# Patient Record
Sex: Female | Born: 1970 | Race: Black or African American | Hispanic: No | Smoking: Never smoker
Health system: Southern US, Community
[De-identification: ages and names within clinical notes are randomized; demographics above are authoritative.]

## PROBLEM LIST (undated history)

## (undated) DIAGNOSIS — E785 Hyperlipidemia, unspecified: Secondary | ICD-10-CM

## (undated) DIAGNOSIS — E119 Type 2 diabetes mellitus without complications: Secondary | ICD-10-CM

## (undated) DIAGNOSIS — I10 Essential (primary) hypertension: Secondary | ICD-10-CM

## (undated) DIAGNOSIS — T7840XA Allergy, unspecified, initial encounter: Secondary | ICD-10-CM

## (undated) HISTORY — DX: Hyperlipidemia, unspecified: E78.5

## (undated) HISTORY — DX: Allergy, unspecified, initial encounter: T78.40XA

## (undated) HISTORY — DX: Essential (primary) hypertension: I10

## (undated) HISTORY — PX: MYOMECTOMY ABDOMINAL APPROACH: SUR870

## (undated) HISTORY — DX: Type 2 diabetes mellitus without complications: E11.9

## (undated) HISTORY — PX: ABDOMINAL HYSTERECTOMY: SHX81

---

## 2014-11-06 ENCOUNTER — Ambulatory Visit: Payer: Self-pay | Admitting: Medical

## 2014-11-06 ENCOUNTER — Encounter: Payer: Self-pay | Admitting: Medical

## 2014-11-06 ENCOUNTER — Ambulatory Visit (INDEPENDENT_AMBULATORY_CARE_PROVIDER_SITE_OTHER): Payer: BC Managed Care – PPO | Admitting: Medical

## 2014-11-06 VITALS — BP 134/88 | HR 97 | Temp 98.2°F | Ht 66.5 in | Wt 219.4 lb

## 2014-11-06 DIAGNOSIS — J3089 Other allergic rhinitis: Secondary | ICD-10-CM | POA: Diagnosis not present

## 2014-11-06 DIAGNOSIS — J309 Allergic rhinitis, unspecified: Secondary | ICD-10-CM | POA: Insufficient documentation

## 2014-11-06 DIAGNOSIS — J0101 Acute recurrent maxillary sinusitis: Secondary | ICD-10-CM | POA: Diagnosis not present

## 2014-11-06 DIAGNOSIS — E785 Hyperlipidemia, unspecified: Secondary | ICD-10-CM

## 2014-11-06 DIAGNOSIS — I10 Essential (primary) hypertension: Secondary | ICD-10-CM | POA: Diagnosis not present

## 2014-11-06 MED ORDER — LORATADINE 10 MG PO TABS: 10.0000 mg | ORAL_TABLET | Freq: Every day | ORAL | Status: DC

## 2014-11-06 MED ORDER — CEFDINIR 300 MG PO CAPS: 300.0000 mg | ORAL_CAPSULE | Freq: Two times a day (BID) | ORAL | Status: DC

## 2014-11-06 MED ORDER — FLUTICASONE PROPIONATE 50 MCG/ACT NA SUSP: 2.0000 | Freq: Every day | NASAL | Status: DC

## 2014-11-06 NOTE — Progress Notes (Signed)
Pre visit review using our clinic review tool, if applicable. No additional management support is needed unless otherwise documented below in the visit note. 

## 2014-11-06 NOTE — Assessment & Plan Note (Addendum)
borderline lipids in past. Will recheck in future.

## 2014-11-06 NOTE — Patient Instructions (Addendum)
HTN (hypertension) pt has been on bp medication for 5 years.   Hyperlipidemia borderline lipids in past. Will recheck in future.  Allergic rhinitis You seem to have some allergy symptoms recently. Will rx claritin and flonase. If signs and symptoms worsen or change let us know.   You have some sinus pressure now. If you use the flonase and claritin.  but sinus pressure persists then start cefdnir antibiotic. I don't think antibiotic indicated just yet. But making available as we approach weekend.  Make appointment for physical in 1-2 months.

## 2014-11-06 NOTE — Progress Notes (Signed)
Subjective:    Patient ID: Christina Leblanc, female    DOB: 07-13-1970, 44 y.o.   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 Walks twice a week, 1 cup of coffee a day, Admits no healthy diet, divorced- no children.  Allergic rhinitis- worse season is fall and spring.   Htn- pt has been on bp medication for 5 years.   hyperlipidemia- borderline lipids in past  LMP- 2003 partial hysterectomy. Has one ovary. Pt told does not have to have pap smear per gynecologist who did procedure. Pt had 3-4 papsmears since the surgery.  Pt in with nasal congestion. This has been for 5 days. Pt states some stuffy sensation at night but very runny nose. Water eyes. No itching to eyes. No sneezing.   No fevers, no chills or sweats. Pt does have some sinus pressure.  Pt has tried nothing else other than garlic tablet.       Review of Systems  Constitutional: Negative for fever, chills, diaphoresis, activity change and fatigue.  HENT: Positive for congestion.   Respiratory: Negative for cough, chest tightness and shortness of breath.   Cardiovascular: Negative for chest pain, palpitations and leg swelling.  Gastrointestinal: Negative for nausea, vomiting and abdominal pain.  Musculoskeletal: Negative for neck pain and neck stiffness.  Neurological: Negative for dizziness and weakness.  Psychiatric/Behavioral: Negative for behavioral problems, confusion and agitation. The patient is not nervous/anxious.     Past Medical History  Diagnosis Date  . Hypertension   . Allergy   . Hyperlipidemia     Social History   Social History  . Marital Status: Divorced    Spouse Name: N/A  . Number of Children: N/A  . Years of Education: N/A   Occupational History  . Not on file.   Social History Main Topics  . Smoking status: Never Smoker   . Smokeless tobacco: Never Used  . Alcohol Use: 0.0 oz/week    0  Standard drinks or equivalent per week     Comment: wine occasionally .Twice.  . Drug Use: No  . Sexual Activity: Not on file   Other Topics Concern  . Not on file   Social History Narrative  . No narrative on file    Past Surgical History  Procedure Laterality Date  . Abdominal hysterectomy      2003    Family History  Problem Relation Age of Onset  . Diabetes Mother   . Diabetes Father   . Heart disease Father   . Cancer Brother   . Hypertension Brother     Allergies  Allergen Reactions  . Claritin [Loratadine]     No current outpatient prescriptions on file prior to visit.   No current facility-administered medications on file prior to visit.    BP 134/88 mmHg  Pulse 97  Temp(Src) 98.2 F (36.8 C) (Oral)  Ht 5' 6.5" (1.689 m)  Wt 219 lb 6.4 oz (99.519 kg)  BMI 34.89 kg/m2  SpO2 98%  LMP        Objective:   Physical Exam  General  Mental Status - Alert. General Appearance - Well groomed. Not in acute distress.  Skin Rashes- No Rashes.  HEENT Head- Normal. Ear Auditory Canal - Left- Normal. Right - Normal.Tympanic Membrane- Left- Normal. Right- Normal. Eye Sclera/Conjunctiva- Left- Normal. Right- Normal. Nose & Sinuses Nasal Mucosa- Left-  Boggy and Congested. Right-  Boggy and  Congested.Bilateral maxillary pressure but no  frontal sinus pressure. Mouth & Throat Lips: Upper Lip- Normal: no dryness, cracking, pallor, cyanosis, or vesicular eruption. Lower Lip-Normal: no dryness, cracking, pallor, cyanosis or vesicular eruption. Buccal Mucosa- Bilateral- No Aphthous ulcers. Oropharynx- No Discharge or Erythema. +pnd. Tonsils: Characteristics- Bilateral- No Erythema or Congestion. Size/Enlargement- Bilateral- No enlargement. Discharge- bilateral-None.  Neck Neck- Supple. No Masses.   Chest and Lung Exam Auscultation: Breath Sounds:-Clear even and unlabored.  Cardiovascular Auscultation:Rythm- Regular, rate and rhythm. Murmurs & Other  Heart Sounds:Ausculatation of the heart reveal- No Murmurs.  Lymphatic Head & Neck General Head & Neck Lymphatics: Bilateral: Description- No Localized lymphadenopathy.       Assessment & Plan:

## 2014-11-06 NOTE — Assessment & Plan Note (Signed)
pt has been on bp medication for 5 years.

## 2014-11-06 NOTE — Assessment & Plan Note (Signed)
You seem to have some allergy symptoms recently. Will rx claritin and flonase. If signs and symptoms worsen or change let us know.

## 2014-12-31 ENCOUNTER — Telehealth: Payer: Self-pay | Admitting: Medical

## 2014-12-31 MED ORDER — HYDROCHLOROTHIAZIDE 25 MG PO TABS: 25.0000 mg | ORAL_TABLET | Freq: Every day | ORAL | Status: DC

## 2014-12-31 NOTE — Telephone Encounter (Signed)
Pharmacy: [id number] - [place], [place]  Reason for call: Pt has been out of hydrochlorothiazide for 2 days. She said [facility] sent request 2 times. Please send today.

## 2014-12-31 NOTE — Telephone Encounter (Signed)
Spoke with pt and she voices understanding that she will make a follow up appointment.

## 2015-01-08 ENCOUNTER — Encounter: Payer: Self-pay | Admitting: Medical

## 2015-01-08 ENCOUNTER — Other Ambulatory Visit (HOSPITAL_COMMUNITY)
Admission: RE | Admit: 2015-01-08 | Discharge: 2015-01-08 | Disposition: A | Discharge status: Home / Self Care | Payer: BC Managed Care – PPO | Source: Ambulatory Visit | Attending: Medical | Admitting: Medical

## 2015-01-08 ENCOUNTER — Ambulatory Visit (INDEPENDENT_AMBULATORY_CARE_PROVIDER_SITE_OTHER): Payer: BC Managed Care – PPO | Admitting: Medical

## 2015-01-08 ENCOUNTER — Ambulatory Visit (HOSPITAL_BASED_OUTPATIENT_CLINIC_OR_DEPARTMENT_OTHER)
Admission: RE | Admit: 2015-01-08 | Discharge: 2015-01-08 | Disposition: A | Discharge status: Home / Self Care | Payer: BC Managed Care – PPO | Source: Ambulatory Visit | Attending: Medical | Admitting: Medical

## 2015-01-08 VITALS — BP 130/88 | HR 78 | Temp 98.3°F | Ht 66.5 in | Wt 223.4 lb

## 2015-01-08 DIAGNOSIS — Z0000 Encounter for general adult medical examination without abnormal findings: Secondary | ICD-10-CM | POA: Diagnosis not present

## 2015-01-08 DIAGNOSIS — Z1211 Encounter for screening for malignant neoplasm of colon: Secondary | ICD-10-CM | POA: Diagnosis not present

## 2015-01-08 DIAGNOSIS — Z0001 Encounter for general adult medical examination with abnormal findings: Secondary | ICD-10-CM | POA: Diagnosis not present

## 2015-01-08 DIAGNOSIS — Z113 Encounter for screening for infections with a predominantly sexual mode of transmission: Secondary | ICD-10-CM | POA: Diagnosis not present

## 2015-01-08 DIAGNOSIS — N390 Urinary tract infection, site not specified: Secondary | ICD-10-CM

## 2015-01-08 DIAGNOSIS — M25572 Pain in left ankle and joints of left foot: Secondary | ICD-10-CM

## 2015-01-08 DIAGNOSIS — M7989 Other specified soft tissue disorders: Secondary | ICD-10-CM | POA: Insufficient documentation

## 2015-01-08 DIAGNOSIS — R82998 Other abnormal findings in urine: Secondary | ICD-10-CM

## 2015-01-08 DIAGNOSIS — Z124 Encounter for screening for malignant neoplasm of cervix: Secondary | ICD-10-CM

## 2015-01-08 DIAGNOSIS — N760 Acute vaginitis: Secondary | ICD-10-CM | POA: Insufficient documentation

## 2015-01-08 DIAGNOSIS — Z01419 Encounter for gynecological examination (general) (routine) without abnormal findings: Secondary | ICD-10-CM | POA: Diagnosis not present

## 2015-01-08 LAB — CBC WITH DIFFERENTIAL/PLATELET
BASOS ABS: 0 10*3/uL (ref 0.0–0.1)
Basophils Relative: 0.7 % (ref 0.0–3.0)
EOS ABS: 0.1 10*3/uL (ref 0.0–0.7)
Eosinophils Relative: 0.8 % (ref 0.0–5.0)
HCT: 41.8 % (ref 36.0–46.0)
HEMOGLOBIN: 13.3 g/dL (ref 12.0–15.0)
Lymphocytes Relative: 40.9 % (ref 12.0–46.0)
Lymphs Abs: 2.8 10*3/uL (ref 0.7–4.0)
MCHC: 31.8 g/dL (ref 30.0–36.0)
MCV: 86.9 fl (ref 78.0–100.0)
MONO ABS: 0.6 10*3/uL (ref 0.1–1.0)
Monocytes Relative: 8.3 % (ref 3.0–12.0)
Neutro Abs: 3.3 10*3/uL (ref 1.4–7.7)
Neutrophils Relative %: 49.3 % (ref 43.0–77.0)
Platelets: 358 10*3/uL (ref 150.0–400.0)
RBC: 4.81 Mil/uL (ref 3.87–5.11)
RDW: 15.7 % — AB (ref 11.5–15.5)
WBC: 6.7 10*3/uL (ref 4.0–10.5)

## 2015-01-08 LAB — LIPID PANEL
CHOL/HDL RATIO: 4
CHOLESTEROL: 222 mg/dL — AB (ref 0–200)
HDL: 50.4 mg/dL (ref >39.00)
LDL CALC: 151 mg/dL — AB (ref 0–99)
NONHDL: 171.9
Triglycerides: 104 mg/dL (ref 0.0–149.0)
VLDL: 20.8 mg/dL (ref 0.0–40.0)

## 2015-01-08 LAB — POCT URINALYSIS DIPSTICK
Bilirubin, UA: NEGATIVE
Glucose, UA: NEGATIVE
KETONES UA: NEGATIVE
Nitrite, UA: NEGATIVE
PROTEIN UA: NEGATIVE
RBC UA: NEGATIVE
SPEC GRAV UA: 1.01
Urobilinogen, UA: 0.2
pH, UA: 6.5

## 2015-01-08 LAB — COMPREHENSIVE METABOLIC PANEL
ALBUMIN: 4.3 g/dL (ref 3.5–5.2)
ALT: 19 U/L (ref 0–35)
AST: 24 U/L (ref 0–37)
Alkaline Phosphatase: 49 U/L (ref 39–117)
BUN: 8 mg/dL (ref 6–23)
CHLORIDE: 97 meq/L (ref 96–112)
CO2: 34 mEq/L — ABNORMAL HIGH (ref 19–32)
CREATININE: 0.83 mg/dL (ref 0.40–1.20)
Calcium: 9.7 mg/dL (ref 8.4–10.5)
GFR: 95.32 mL/min (ref >60.00)
Glucose, Bld: 105 mg/dL — ABNORMAL HIGH (ref 70–99)
Potassium: 3.4 mEq/L — ABNORMAL LOW (ref 3.5–5.1)
SODIUM: 138 meq/L (ref 135–145)
TOTAL PROTEIN: 7.9 g/dL (ref 6.0–8.3)
Total Bilirubin: 0.5 mg/dL (ref 0.2–1.2)

## 2015-01-08 LAB — TSH: TSH: 1.58 u[IU]/mL (ref 0.35–4.50)

## 2015-01-08 MED ORDER — HYDROCHLOROTHIAZIDE 25 MG PO TABS: 25.0000 mg | ORAL_TABLET | Freq: Every day | ORAL | Status: DC

## 2015-01-08 NOTE — Assessment & Plan Note (Signed)
Will get cbc, cmp, tsh, lipid panel today. As well as ua.

## 2015-01-08 NOTE — Patient Instructions (Addendum)
Wellness examination Will get cbc, cmp, tsh, lipid panel today. As well as ua.  Also do std screening and papsmear.  For your ankle pain intermittent will get ankle xray today.  Follow up 1 year for physical or as needed.(3-4 weeks bp check if bp still elevated after exam)   Counseled with pt that I think was successful with pap but will wait on results if not then will need repeat. Would recommend with gynecologist. Also since I did not do the bimanual today would recommend that on wellness exam next year she would need that. We could have her do that with female provider.     Preventive Care for Adults, Female A healthy lifestyle and preventive care can promote health and wellness. Preventive health guidelines for women include the following key practices.  A routine yearly physical is a good way to check with your health care provider about your health and preventive screening. It is a chance to share any concerns and updates on your health and to receive a thorough exam.  Visit your dentist for a routine exam and preventive care every 6 months. Brush your teeth twice a day and floss once a day. Good oral hygiene prevents tooth decay and gum disease.  The frequency of eye exams is based on your age, health, family medical history, use of contact lenses, and other factors. Follow your health care provider's recommendations for frequency of eye exams.  Eat a healthy diet. Foods like vegetables, fruits, whole grains, low-fat dairy products, and lean protein foods contain the nutrients you need without too many calories. Decrease your intake of foods high in solid fats, added sugars, and salt. Eat the right amount of calories for you.Get information about a proper diet from your health care provider, if necessary.  Regular physical exercise is one of the most important things you can do for your health. Most adults should get at least 150 minutes of moderate-intensity exercise (any activity  that increases your heart rate and causes you to sweat) each week. In addition, most adults need muscle-strengthening exercises on 2 or more days a week.  Maintain a healthy weight. The body mass index (BMI) is a screening tool to identify possible weight problems. It provides an estimate of body fat based on height and weight. Your health care provider can find your BMI and can help you achieve or maintain a healthy weight.For adults 20 years and older:  A BMI below 18.5 is considered underweight.  A BMI of 18.5 to 24.9 is normal.  A BMI of 25 to 29.9 is considered overweight.  A BMI of 30 and above is considered obese.  Maintain normal blood lipids and cholesterol levels by exercising and minimizing your intake of saturated fat. Eat a balanced diet with plenty of fruit and vegetables. Blood tests for lipids and cholesterol should begin at age 6 and be repeated every 5 years. If your lipid or cholesterol levels are high, you are over 50, or you are at high risk for heart disease, you may need your cholesterol levels checked more frequently.Ongoing high lipid and cholesterol levels should be treated with medicines if diet and exercise are not working.  If you smoke, find out from your health care provider how to quit. If you do not use tobacco, do not start.  Lung cancer screening is recommended for adults aged 25-80 years who are at high risk for developing lung cancer because of a history of smoking. A yearly low-dose CT scan of  the lungs is recommended for people who have at least a 30-pack-year history of smoking and are a current smoker or have quit within the past 15 years. A pack year of smoking is smoking an average of 1 pack of cigarettes a day for 1 year (for example: 1 pack a day for 30 years or 2 packs a day for 15 years). Yearly screening should continue until the smoker has stopped smoking for at least 15 years. Yearly screening should be stopped for people who develop a health  problem that would prevent them from having lung cancer treatment.  If you are pregnant, do not drink alcohol. If you are breastfeeding, be very cautious about drinking alcohol. If you are not pregnant and choose to drink alcohol, do not have more than 1 drink per day. One drink is considered to be 12 ounces (355 mL) of beer, 5 ounces (148 mL) of wine, or 1.5 ounces (44 mL) of liquor.  Avoid use of street drugs. Do not share needles with anyone. Ask for help if you need support or instructions about stopping the use of drugs.  High blood pressure causes heart disease and increases the risk of stroke. Your blood pressure should be checked at least every 1 to 2 years. Ongoing high blood pressure should be treated with medicines if weight loss and exercise do not work.  If you are 48-95 years old, ask your health care provider if you should take aspirin to prevent strokes.  Diabetes screening is done by taking a blood sample to check your blood glucose level after you have not eaten for a certain period of time (fasting). If you are not overweight and you do not have risk factors for diabetes, you should be screened once every 3 years starting at age 86. If you are overweight or obese and you are 15-28 years of age, you should be screened for diabetes every year as part of your cardiovascular risk assessment.  Breast cancer screening is essential preventive care for women. You should practice "breast self-awareness." This means understanding the normal appearance and feel of your breasts and may include breast self-examination. Any changes detected, no matter how small, should be reported to a health care provider. Women in their 49s and 30s should have a clinical breast exam (CBE) by a health care provider as part of a regular health exam every 1 to 3 years. After age 48, women should have a CBE every year. Starting at age 30, women should consider having a mammogram (breast X-ray test) every year. Women  who have a family history of breast cancer should talk to their health care provider about genetic screening. Women at a high risk of breast cancer should talk to their health care providers about having an MRI and a mammogram every year.  Breast cancer gene (BRCA)-related cancer risk assessment is recommended for women who have family members with BRCA-related cancers. BRCA-related cancers include breast, ovarian, tubal, and peritoneal cancers. Having family members with these cancers may be associated with an increased risk for harmful changes (mutations) in the breast cancer genes BRCA1 and BRCA2. Results of the assessment will determine the need for genetic counseling and BRCA1 and BRCA2 testing.  Your health care provider may recommend that you be screened regularly for cancer of the pelvic organs (ovaries, uterus, and vagina). This screening involves a pelvic examination, including checking for microscopic changes to the surface of your cervix (Pap test). You may be encouraged to have this screening done  every 3 years, beginning at age 18.  For women ages 51-65, health care providers may recommend pelvic exams and Pap testing every 3 years, or they may recommend the Pap and pelvic exam, combined with testing for human papilloma virus (HPV), every 5 years. Some types of HPV increase your risk of cervical cancer. Testing for HPV may also be done on women of any age with unclear Pap test results.  Other health care providers may not recommend any screening for nonpregnant women who are considered low risk for pelvic cancer and who do not have symptoms. Ask your health care provider if a screening pelvic exam is right for you.  If you have had past treatment for cervical cancer or a condition that could lead to cancer, you need Pap tests and screening for cancer for at least 20 years after your treatment. If Pap tests have been discontinued, your risk factors (such as having a new sexual partner) need to  be reassessed to determine if screening should resume. Some women have medical problems that increase the chance of getting cervical cancer. In these cases, your health care provider may recommend more frequent screening and Pap tests.  Colorectal cancer can be detected and often prevented. Most routine colorectal cancer screening begins at the age of 53 years and continues through age 66 years. However, your health care provider may recommend screening at an earlier age if you have risk factors for colon cancer. On a yearly basis, your health care provider may provide home test kits to check for hidden blood in the stool. Use of a small camera at the end of a tube, to directly examine the colon (sigmoidoscopy or colonoscopy), can detect the earliest forms of colorectal cancer. Talk to your health care provider about this at age 32, when routine screening begins. Direct exam of the colon should be repeated every 5-10 years through age 80 years, unless early forms of precancerous polyps or small growths are found.  People who are at an increased risk for hepatitis B should be screened for this virus. You are considered at high risk for hepatitis B if:  You were born in a country where hepatitis B occurs often. Talk with your health care provider about which countries are considered high risk.  Your parents were born in a high-risk country and you have not received a shot to protect against hepatitis B (hepatitis B vaccine).  You have HIV or AIDS.  You use needles to inject street drugs.  You live with, or have sex with, someone who has hepatitis B.  You get hemodialysis treatment.  You take certain medicines for conditions like cancer, organ transplantation, and autoimmune conditions.  Hepatitis C blood testing is recommended for all people born from [birth year] through 1966 and any individual with known risks for hepatitis C.  Practice safe sex. Use condoms and avoid high-risk sexual practices to  reduce the spread of sexually transmitted infections (STIs). STIs include gonorrhea, chlamydia, syphilis, trichomonas, herpes, HPV, and human immunodeficiency virus (HIV). Herpes, HIV, and HPV are viral illnesses that have no cure. They can result in disability, cancer, and death.  You should be screened for sexually transmitted illnesses (STIs) including gonorrhea and chlamydia if:  You are sexually active and are younger than 24 years.  You are older than 24 years and your health care provider tells you that you are at risk for this type of infection.  Your sexual activity has changed since you were last screened and you  are at an increased risk for chlamydia or gonorrhea. Ask your health care provider if you are at risk.  If you are at risk of being infected with HIV, it is recommended that you take a prescription medicine daily to prevent HIV infection. This is called preexposure prophylaxis (PrEP). You are considered at risk if:  You are sexually active and do not regularly use condoms or know the HIV status of your partner(s).  You take drugs by injection.  You are sexually active with a partner who has HIV.  Talk with your health care provider about whether you are at high risk of being infected with HIV. If you choose to begin PrEP, you should first be tested for HIV. You should then be tested every 3 months for as long as you are taking PrEP.  Osteoporosis is a disease in which the bones lose minerals and strength with aging. This can result in serious bone fractures or breaks. The risk of osteoporosis can be identified using a bone density scan. Women ages 57 years and over and women at risk for fractures or osteoporosis should discuss screening with their health care providers. Ask your health care provider whether you should take a calcium supplement or vitamin D to reduce the rate of osteoporosis.  Menopause can be associated with physical symptoms and risks. Hormone replacement  therapy is available to decrease symptoms and risks. You should talk to your health care provider about whether hormone replacement therapy is right for you.  Use sunscreen. Apply sunscreen liberally and repeatedly throughout the day. You should seek shade when your shadow is shorter than you. Protect yourself by wearing long sleeves, pants, a wide-brimmed hat, and sunglasses year round, whenever you are outdoors.  Once a month, do a whole body skin exam, using a mirror to look at the skin on your back. Tell your health care provider of new moles, moles that have irregular borders, moles that are larger than a pencil eraser, or moles that have changed in shape or color.  Stay current with required vaccines (immunizations).  Influenza vaccine. All adults should be immunized every year.  Tetanus, diphtheria, and acellular pertussis (Td, Tdap) vaccine. Pregnant women should receive 1 dose of Tdap vaccine during each pregnancy. The dose should be obtained regardless of the length of time since the last dose. Immunization is preferred during the 27th-36th week of gestation. An adult who has not previously received Tdap or who does not know her vaccine status should receive 1 dose of Tdap. This initial dose should be followed by tetanus and diphtheria toxoids (Td) booster doses every 10 years. Adults with an unknown or incomplete history of completing a 3-dose immunization series with Td-containing vaccines should begin or complete a primary immunization series including a Tdap dose. Adults should receive a Td booster every 10 years.  Varicella vaccine. An adult without evidence of immunity to varicella should receive 2 doses or a second dose if she has previously received 1 dose. Pregnant females who do not have evidence of immunity should receive the first dose after pregnancy. This first dose should be obtained before leaving the health care facility. The second dose should be obtained 4-8 weeks after the  first dose.  Human papillomavirus (HPV) vaccine. Females aged 13-26 years who have not received the vaccine previously should obtain the 3-dose series. The vaccine is not recommended for use in pregnant females. However, pregnancy testing is not needed before receiving a dose. If a female is found to  be pregnant after receiving a dose, no treatment is needed. In that case, the remaining doses should be delayed until after the pregnancy. Immunization is recommended for any person with an immunocompromised condition through the age of 92 years if she did not get any or all doses earlier. During the 3-dose series, the second dose should be obtained 4-8 weeks after the first dose. The third dose should be obtained 24 weeks after the first dose and 16 weeks after the second dose.  Zoster vaccine. One dose is recommended for adults aged 64 years or older unless certain conditions are present.  Measles, mumps, and rubella (MMR) vaccine. Adults born before [birth year] generally are considered immune to measles and mumps. Adults born in [birth year] or later should have 1 or more doses of MMR vaccine unless there is a contraindication to the vaccine or there is laboratory evidence of immunity to each of the three diseases. A routine second dose of MMR vaccine should be obtained at least 28 days after the first dose for students attending postsecondary schools, health care workers, or international travelers. People who received inactivated measles vaccine or an unknown type of measles vaccine during 1963-1967 should receive 2 doses of MMR vaccine. People who received inactivated mumps vaccine or an unknown type of mumps vaccine before 1979 and are at high risk for mumps infection should consider immunization with 2 doses of MMR vaccine. For females of childbearing age, rubella immunity should be determined. If there is no evidence of immunity, females who are not pregnant should be vaccinated. If there is no evidence of immunity,  females who are pregnant should delay immunization until after pregnancy. Unvaccinated health care workers born before [birth year] who lack laboratory evidence of measles, mumps, or rubella immunity or laboratory confirmation of disease should consider measles and mumps immunization with 2 doses of MMR vaccine or rubella immunization with 1 dose of MMR vaccine.  Pneumococcal 13-valent conjugate (PCV13) vaccine. When indicated, a person who is uncertain of his immunization history and has no record of immunization should receive the PCV13 vaccine. All adults 43 years of age and older should receive this vaccine. An adult aged 30 years or older who has certain medical conditions and has not been previously immunized should receive 1 dose of PCV13 vaccine. This PCV13 should be followed with a dose of pneumococcal polysaccharide (PPSV23) vaccine. Adults who are at high risk for pneumococcal disease should obtain the PPSV23 vaccine at least 8 weeks after the dose of PCV13 vaccine. Adults older than 45 years of age who have normal immune system function should obtain the PPSV23 vaccine dose at least 1 year after the dose of PCV13 vaccine.  Pneumococcal polysaccharide (PPSV23) vaccine. When PCV13 is also indicated, PCV13 should be obtained first. All adults aged 49 years and older should be immunized. An adult younger than age 17 years who has certain medical conditions should be immunized. Any person who resides in a nursing home or long-term care facility should be immunized. An adult smoker should be immunized. People with an immunocompromised condition and certain other conditions should receive both PCV13 and PPSV23 vaccines. People with human immunodeficiency virus (HIV) infection should be immunized as soon as possible after diagnosis. Immunization during chemotherapy or radiation therapy should be avoided. Routine use of PPSV23 vaccine is not recommended for American Indians, [place] Natives, or people younger than 65  years unless there are medical conditions that require PPSV23 vaccine. When indicated, people who have unknown immunization and have no record  of immunization should receive PPSV23 vaccine. One-time revaccination 5 years after the first dose of PPSV23 is recommended for people aged 19-64 years who have chronic kidney failure, nephrotic syndrome, asplenia, or immunocompromised conditions. People who received 1-2 doses of PPSV23 before age 84 years should receive another dose of PPSV23 vaccine at age 33 years or later if at least 5 years have passed since the previous dose. Doses of PPSV23 are not needed for people immunized with PPSV23 at or after age 62 years.  Meningococcal vaccine. Adults with asplenia or persistent complement component deficiencies should receive 2 doses of quadrivalent meningococcal conjugate (MenACWY-D) vaccine. The doses should be obtained at least 2 months apart. Microbiologists working with certain meningococcal bacteria, [employer] recruits, people at risk during an outbreak, and people who travel to or live in countries with a high rate of meningitis should be immunized. A first-year college student up through age 47 years who is living in a residence hall should receive a dose if she did not receive a dose on or after her 16th birthday. Adults who have certain high-risk conditions should receive one or more doses of vaccine.  Hepatitis A vaccine. Adults who wish to be protected from this disease, have certain high-risk conditions, work with hepatitis A-infected animals, work in hepatitis A research labs, or travel to or work in countries with a high rate of hepatitis A should be immunized. Adults who were previously unvaccinated and who anticipate close contact with an international adoptee during the first 60 days after arrival in the [place] from a country with a high rate of hepatitis A should be immunized.  Hepatitis B vaccine. Adults who wish to be protected from this  disease, have certain high-risk conditions, may be exposed to blood or other infectious body fluids, are household contacts or sex partners of hepatitis B positive people, are clients or workers in certain care facilities, or travel to or work in countries with a high rate of hepatitis B should be immunized.  Haemophilus influenzae type b (Hib) vaccine. A previously unvaccinated person with asplenia or sickle cell disease or having a scheduled splenectomy should receive 1 dose of Hib vaccine. Regardless of previous immunization, a recipient of a hematopoietic stem cell transplant should receive a 3-dose series 6-12 months after her successful transplant. Hib vaccine is not recommended for adults with HIV infection. Preventive Services / Frequency Ages 2 to 90 years  Blood pressure check.** / Every 3-5 years.  Lipid and cholesterol check.** / Every 5 years beginning at age 76.  Clinical breast exam.** / Every 3 years for women in their 2s and 27s.  BRCA-related cancer risk assessment.** / For women who have family members with a BRCA-related cancer (breast, ovarian, tubal, or peritoneal cancers).  Pap test.** / Every 2 years from ages 34 through 70. Every 3 years starting at age 76 through age 38 or 20 with a history of 3 consecutive normal Pap tests.  HPV screening.** / Every 3 years from ages 70 through ages 77 to 13 with a history of 3 consecutive normal Pap tests.  Hepatitis C blood test.** / For any individual with known risks for hepatitis C.  Skin self-exam. / Monthly.  Influenza vaccine. / Every year.  Tetanus, diphtheria, and acellular pertussis (Tdap, Td) vaccine.** / Consult your health care provider. Pregnant women should receive 1 dose of Tdap vaccine during each pregnancy. 1 dose of Td every 10 years.  Varicella vaccine.** / Consult your health care provider. Pregnant females  who do not have evidence of immunity should receive the first dose after pregnancy.  HPV vaccine. /  3 doses over 6 months, if 4 and younger. The vaccine is not recommended for use in pregnant females. However, pregnancy testing is not needed before receiving a dose.  Measles, mumps, rubella (MMR) vaccine.** / You need at least 1 dose of MMR if you were born in 1957 or later. You may also need a 2nd dose. For females of childbearing age, rubella immunity should be determined. If there is no evidence of immunity, females who are not pregnant should be vaccinated. If there is no evidence of immunity, females who are pregnant should delay immunization until after pregnancy.  Pneumococcal 13-valent conjugate (PCV13) vaccine.** / Consult your health care provider.  Pneumococcal polysaccharide (PPSV23) vaccine.** / 1 to 2 doses if you smoke cigarettes or if you have certain conditions.  Meningococcal vaccine.** / 1 dose if you are age 86 to 25 years and a Market researcher living in a residence hall, or have one of several medical conditions, you need to get vaccinated against meningococcal disease. You may also need additional booster doses.  Hepatitis A vaccine.** / Consult your health care provider.  Hepatitis B vaccine.** / Consult your health care provider.  Haemophilus influenzae type b (Hib) vaccine.** / Consult your health care provider. Ages 33 to 69 years  Blood pressure check.** / Every year.  Lipid and cholesterol check.** / Every 5 years beginning at age 99 years.  Lung cancer screening. / Every year if you are aged 5-80 years and have a 30-pack-year history of smoking and currently smoke or have quit within the past 15 years. Yearly screening is stopped once you have quit smoking for at least 15 years or develop a health problem that would prevent you from having lung cancer treatment.  Clinical breast exam.** / Every year after age 16 years.  BRCA-related cancer risk assessment.** / For women who have family members with a BRCA-related cancer (breast, ovarian, tubal, or  peritoneal cancers).  Mammogram.** / Every year beginning at age 55 years and continuing for as long as you are in good health. Consult with your health care provider.  Pap test.** / Every 3 years starting at age 85 years through age 75 or 55 years with a history of 3 consecutive normal Pap tests.  HPV screening.** / Every 3 years from ages 63 years through ages 63 to 25 years with a history of 3 consecutive normal Pap tests.  Fecal occult blood test (FOBT) of stool. / Every year beginning at age 16 years and continuing until age 70 years. You may not need to do this test if you get a colonoscopy every 10 years.  Flexible sigmoidoscopy or colonoscopy.** / Every 5 years for a flexible sigmoidoscopy or every 10 years for a colonoscopy beginning at age 29 years and continuing until age 72 years.  Hepatitis C blood test.** / For all people born from [birth year] through 1966 and any individual with known risks for hepatitis C.  Skin self-exam. / Monthly.  Influenza vaccine. / Every year.  Tetanus, diphtheria, and acellular pertussis (Tdap/Td) vaccine.** / Consult your health care provider. Pregnant women should receive 1 dose of Tdap vaccine during each pregnancy. 1 dose of Td every 10 years.  Varicella vaccine.** / Consult your health care provider. Pregnant females who do not have evidence of immunity should receive the first dose after pregnancy.  Zoster vaccine.** / 1 dose for adults aged  72 years or older.  Measles, mumps, rubella (MMR) vaccine.** / You need at least 1 dose of MMR if you were born in 1957 or later. You may also need a second dose. For females of childbearing age, rubella immunity should be determined. If there is no evidence of immunity, females who are not pregnant should be vaccinated. If there is no evidence of immunity, females who are pregnant should delay immunization until after pregnancy.  Pneumococcal 13-valent conjugate (PCV13) vaccine.** / Consult your health care  provider.  Pneumococcal polysaccharide (PPSV23) vaccine.** / 1 to 2 doses if you smoke cigarettes or if you have certain conditions.  Meningococcal vaccine.** / Consult your health care provider.  Hepatitis A vaccine.** / Consult your health care provider.  Hepatitis B vaccine.** / Consult your health care provider.  Haemophilus influenzae type b (Hib) vaccine.** / Consult your health care provider. Ages 65 years and over  Blood pressure check.** / Every year.  Lipid and cholesterol check.** / Every 5 years beginning at age 32 years.  Lung cancer screening. / Every year if you are aged 75-80 years and have a 30-pack-year history of smoking and currently smoke or have quit within the past 15 years. Yearly screening is stopped once you have quit smoking for at least 15 years or develop a health problem that would prevent you from having lung cancer treatment.  Clinical breast exam.** / Every year after age 28 years.  BRCA-related cancer risk assessment.** / For women who have family members with a BRCA-related cancer (breast, ovarian, tubal, or peritoneal cancers).  Mammogram.** / Every year beginning at age 9 years and continuing for as long as you are in good health. Consult with your health care provider.  Pap test.** / Every 3 years starting at age 28 years through age 55 or 36 years with 3 consecutive normal Pap tests. Testing can be stopped between 65 and 70 years with 3 consecutive normal Pap tests and no abnormal Pap or HPV tests in the past 10 years.  HPV screening.** / Every 3 years from ages 14 years through ages 97 or 48 years with a history of 3 consecutive normal Pap tests. Testing can be stopped between 65 and 70 years with 3 consecutive normal Pap tests and no abnormal Pap or HPV tests in the past 10 years.  Fecal occult blood test (FOBT) of stool. / Every year beginning at age 19 years and continuing until age 52 years. You may not need to do this test if you get a  colonoscopy every 10 years.  Flexible sigmoidoscopy or colonoscopy.** / Every 5 years for a flexible sigmoidoscopy or every 10 years for a colonoscopy beginning at age 83 years and continuing until age 10 years.  Hepatitis C blood test.** / For all people born from [birth year] through 1966 and any individual with known risks for hepatitis C.  Osteoporosis screening.** / A one-time screening for women ages 76 years and over and women at risk for fractures or osteoporosis.  Skin self-exam. / Monthly.  Influenza vaccine. / Every year.  Tetanus, diphtheria, and acellular pertussis (Tdap/Td) vaccine.** / 1 dose of Td every 10 years.  Varicella vaccine.** / Consult your health care provider.  Zoster vaccine.** / 1 dose for adults aged 56 years or older.  Pneumococcal 13-valent conjugate (PCV13) vaccine.** / Consult your health care provider.  Pneumococcal polysaccharide (PPSV23) vaccine.** / 1 dose for all adults aged 68 years and older.  Meningococcal vaccine.** / Consult your health care  provider.  Hepatitis A vaccine.** / Consult your health care provider.  Hepatitis B vaccine.** / Consult your health care provider.  Haemophilus influenzae type b (Hib) vaccine.** / Consult your health care provider. ** Family history and personal history of risk and conditions may change your health care provider's recommendations.   This information is not intended to replace advice given to you by your health care provider. Make sure you discuss any questions you have with your health care provider.   Document Released: 03/01/2001 Document Revised: 01/24/2014 Document Reviewed: 05/31/2010 Elsevier Interactive Patient Education [employer].

## 2015-01-08 NOTE — Addendum Note (Signed)
Addended by: Tasia Catchings on: 01/08/2015 01:07 PM   Modules accepted: Orders

## 2015-01-08 NOTE — Addendum Note (Signed)
Addended by: Tasia Catchings on: 01/08/2015 01:30 PM   Modules accepted: Orders

## 2015-01-08 NOTE — Progress Notes (Signed)
Subjective:    Patient ID: Christina Leblanc, female    DOB: [birth year], 44 y.o.   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 Walks twice a week, 1 cup of coffee a day, Admits no healthy diet, divorced- no children.  Pt is behind on her pap smears. She want to get done today.  Pt declines flu vaccine today.  No family history of colon cancer.  Also on and off pain medial aspect of left ankle. Describes ankle twist injury. Pain occurs randomly and when has pain last for about one hour.   Review of Systems  Constitutional: Negative for fever, chills, diaphoresis, activity change and fatigue.  Respiratory: Negative for cough, chest tightness and shortness of breath.   Cardiovascular: Negative for chest pain, palpitations and leg swelling.  Gastrointestinal: Negative for nausea, vomiting and abdominal pain.  Musculoskeletal: Negative for neck pain and neck stiffness.  Skin: Negative for rash.  Neurological: Negative for dizziness, seizures, facial asymmetry and weakness.  Psychiatric/Behavioral: Negative for behavioral problems, confusion and agitation. The patient is not nervous/anxious.    Past Medical History  Diagnosis Date  . Hypertension   . Allergy   . Hyperlipidemia     Social History   Social History  . Marital Status: Divorced    Spouse Name: N/A  . Number of Children: N/A  . Years of Education: N/A   Occupational History  . Not on file.   Social History Main Topics  . Smoking status: Never Smoker   . Smokeless tobacco: Never Used  . Alcohol Use: 0.0 oz/week    0 Standard drinks or equivalent per week     Comment: wine occasionally .Twice.  . Drug Use: No  . Sexual Activity: Not on file   Other Topics Concern  . Not on file   Social History Narrative    Past Surgical History  Procedure Laterality Date  . Abdominal hysterectomy      2003    Family History    Problem Relation Age of Onset  . Diabetes Mother   . Diabetes Father   . Heart disease Father   . Cancer Brother   . Hypertension Brother     Allergies  Allergen Reactions  . Claritin [Loratadine]     Current Outpatient Prescriptions on File Prior to Visit  Medication Sig Dispense Refill  . acetaminophen (TYLENOL) 500 MG tablet Take 500 mg by mouth every 6 (six) hours as needed.    . hydrochlorothiazide (HYDRODIURIL) 25 MG tablet Take 1 tablet (25 mg total) by mouth daily. 14 tablet 0  . loratadine (CLARITIN) 10 MG tablet Take 1 tablet (10 mg total) by mouth daily. 30 tablet 1  . NON FORMULARY      No current facility-administered medications on file prior to visit.    BP 130/90 mmHg  Pulse 78  Temp(Src) 98.3 F (36.8 C) (Oral)  Ht 5' 6.5" (1.689 m)  Wt 223 lb 6.4 oz (101.334 kg)  BMI 35.52 kg/m2  SpO2 98%       Objective:   Physical Exam  General   Mental Status- Alert.  Orientation-Oriented x3. Build and Nutrition Well Nourished and Well Developed.  Skin General: Normal.  Color- Normal color. Moisture- Dry.Temperature warm. Lesions: No suspicious lesions  Head, Eyes, Ears, Nose, Thoat Ears-Normal. Auditory Canal-Bilateral-Normal. Tympanic Membrane- Bilateral-Normal. Eyes Fundi- Bilateral-Normal. Pupil- Bilateral- Direct reaction to light normal.  Nose & Sinuses- Normal. Nostril- Bilateral-Normal.  Neck Neck- No Bruits or Masses. Thyroid- Normal. No thyromegaly or nodules.  Breast Breast Lump: No palpable masses, symmetric, no axillary lymphadenopathy palpated.  Chest and Lung Exam  Percussion: Quality and Intensity:-Percussion normal. Percussion of chest reveals- No Dullness. Palpation of the chest reveals- Non-tender. Auscultation: Breath sounds-Normal. Adventitious  Sounds:No adventitious   Vaginal External: Labia majora and minora normal/no lesions. Pelvic/Bimanual exam: Cervical OS not red or friable. No discharge.   Pt had extreme  difficult time with pap. She virtually closed both legs together during pap. Such sensitivity decided not to do bi-manual.(note cervix did not look beefy red. And no purulent discharge.    Cardiovascular Inspection: No Heaves. Auscultation: Heart Sounds- Normal sinus rhythm without murmur or gallop, S1 WNL and S2 WNL.  Abdomen Inspection:- Inspection Normal. Inspection of abdomen reveals- No Hernias. Palpation/Percussion: Palpation and Percussion of the abdomen reveal- Non Tender and No Palpable masses. Liver: Other Characteristics- No Hepatmegaly Spleen:Other Characteristics- No Splenomegaly. Auscultation: Auscultation of the abdomen reveals-Bowel sounds normal and No Abdominal bruits.   Neurologic Mental Status- Normal Cranial Nerves- Normal Bilaterally, Motor- Normal. Strength:5/5 normal muscle strength- All Muscles.  Gait- Normal. Meningeal Signs- None.  Musculoskeletal Global Assessment General- Joints show full range of motion without obvious deformity and Normal muscle mass. Strength 5/5 in upper and lower extremities.  Lymphatic General lymphatics Description-No Generalized lymphadenopathy.         Assessment & Plan:

## 2015-01-08 NOTE — Progress Notes (Signed)
Pre visit review using our clinic review tool, if applicable. No additional management support is needed unless otherwise documented below in the visit note. 

## 2015-01-09 ENCOUNTER — Telehealth: Payer: Self-pay | Admitting: Medical

## 2015-01-09 LAB — URINE CYTOLOGY ANCILLARY ONLY
Chlamydia: NEGATIVE
NEISSERIA GONORRHEA: NEGATIVE
TRICH (WINDOWPATH): POSITIVE — AB

## 2015-01-09 LAB — RPR

## 2015-01-09 LAB — HIV ANTIBODY (ROUTINE TESTING W REFLEX): HIV 1&2 Ab, 4th Generation: NONREACTIVE

## 2015-01-09 LAB — URINE CULTURE
COLONY COUNT: NO GROWTH
Organism ID, Bacteria: NO GROWTH

## 2015-01-09 LAB — CYTOLOGY - PAP

## 2015-01-09 MED ORDER — METRONIDAZOLE 500 MG PO TABS: 500.0000 mg | ORAL_TABLET | Freq: Three times a day (TID) | ORAL | Status: DC

## 2015-01-09 NOTE — Telephone Encounter (Signed)
rx flagyl sent for trich.

## 2015-01-13 ENCOUNTER — Telehealth: Payer: Self-pay | Admitting: Medical

## 2015-01-13 NOTE — Telephone Encounter (Signed)
ifob not turned in. Will you remind pt that I want that done.

## 2015-01-13 NOTE — Telephone Encounter (Signed)
Caller name: Self   Can be reached: [phone]   Reason for call: Patient wants to know why she was given a rx for metroNIDAZOLE (FLAGYL) 500 MG tablet [id number] before she starts to take it

## 2015-01-14 NOTE — Telephone Encounter (Signed)
Spoke with pt and she states that she was advised on 01/13/15 about the results from one of the nurses in the office. Pt voices understanding.

## 2015-01-29 ENCOUNTER — Encounter: Payer: Self-pay | Admitting: Medical

## 2015-01-29 ENCOUNTER — Ambulatory Visit (INDEPENDENT_AMBULATORY_CARE_PROVIDER_SITE_OTHER): Payer: BC Managed Care – PPO | Admitting: Medical

## 2015-01-29 VITALS — BP 128/86 | HR 95 | Temp 98.1°F | Ht 66.5 in | Wt 222.6 lb

## 2015-01-29 DIAGNOSIS — E876 Hypokalemia: Secondary | ICD-10-CM

## 2015-01-29 DIAGNOSIS — I10 Essential (primary) hypertension: Secondary | ICD-10-CM | POA: Diagnosis not present

## 2015-01-29 MED ORDER — HYDROCHLOROTHIAZIDE 25 MG PO TABS: 25.0000 mg | ORAL_TABLET | Freq: Every day | ORAL | Status: DC

## 2015-01-29 NOTE — Progress Notes (Signed)
Pre visit review using our clinic review tool, if applicable. No additional management support is needed unless otherwise documented below in the visit note. 

## 2015-01-29 NOTE — Patient Instructions (Addendum)
Your bp is good today and better at home when you check. Continue the HCTZ.   For you low k, will get cmp today. Will see if you can just eat banana every other day or if you need to use k tablets.  Follow up to be determined after labs reviewed.

## 2015-01-29 NOTE — Progress Notes (Signed)
Subjective:    Patient ID: Christina Leblanc, female    DOB: July 04, 1970, 45 y.o.   MRN: [id number]  HPI  Pt in for blood pressure check. BP controlled today. No cardiac or neurologic signs or symptoms. Pt is only on hctz. No muscle cramps. Pt k was mild low on cmp 3 wks ago.  Her  bp reading at home 120-128/84-85.   Review of Systems  Constitutional: Negative for fever, chills and fatigue.  Respiratory: Negative for cough, choking, shortness of breath and wheezing.   Cardiovascular: Negative for chest pain and palpitations.  Gastrointestinal: Negative for abdominal pain.  Musculoskeletal: Negative for back pain.  Skin: Negative for rash.  Neurological: Negative for dizziness and headaches.  Hematological: Negative for adenopathy. Does not bruise/bleed easily.    Past Medical History  Diagnosis Date  . Hypertension   . Allergy   . Hyperlipidemia     Social History   Social History  . Marital Status: Divorced    Spouse Name: N/A  . Number of Children: N/A  . Years of Education: N/A   Occupational History  . Not on file.   Social History Main Topics  . Smoking status: Never Smoker   . Smokeless tobacco: Never Used  . Alcohol Use: 0.0 oz/week    0 Standard drinks or equivalent per week     Comment: wine occasionally .Twice.  . Drug Use: No  . Sexual Activity: Not on file   Other Topics Concern  . Not on file   Social History Narrative    Past Surgical History  Procedure Laterality Date  . Abdominal hysterectomy      2003    Family History  Problem Relation Age of Onset  . Diabetes Mother   . Diabetes Father   . Heart disease Father   . Cancer Brother   . Hypertension Brother     Allergies  Allergen Reactions  . Claritin [Loratadine]     Current Outpatient Prescriptions on File Prior to Visit  Medication Sig Dispense Refill  . acetaminophen (TYLENOL) 500 MG tablet Take 500 mg by mouth every 6 (six) hours as needed.    . hydrochlorothiazide  (HYDRODIURIL) 25 MG tablet Take 1 tablet (25 mg total) by mouth daily. 30 tablet 0  . NON FORMULARY      No current facility-administered medications on file prior to visit.    BP 128/86 mmHg  Pulse 95  Temp(Src) 98.1 F (36.7 C) (Oral)  Ht 5' 6.5" (1.689 m)  Wt 222 lb 9.6 oz (100.971 kg)  BMI 35.39 kg/m2  SpO2 98%       Objective:   Physical Exam  General Mental Status- Alert. General Appearance- Not in acute distress.   Skin General: Color- Normal Color. Moisture- Normal Moisture.  Neck Carotid Arteries- Normal color. Moisture- Normal Moisture. No carotid bruits. No JVD.  Chest and Lung Exam Auscultation: Breath Sounds:-Normal.  Cardiovascular Auscultation:Rythm- Regular. Murmurs & Other Heart Sounds:Auscultation of the heart reveals- No Murmurs.  Abdomen Inspection:-Inspeection Normal. Palpation/Percussion:Note:No mass. Palpation and Percussion of the abdomen reveal- Non Tender, Non Distended + BS, no rebound or guarding.    Neurologic Cranial Nerve exam:- CN III-XII intact(No nystagmus), symmetric smile. Strength:- 5/5 equal and symmetric strength both upper and lower extremities.     Assessment & Plan:  Your bp is good today and better at home when you check. Continue the HCTZ.   For you low k, will get cmp today. Will see if you can just  eat banana every other day or if you need to use k tablets.  Follow up to be determined after labs reviewed.

## 2015-01-30 ENCOUNTER — Other Ambulatory Visit: Payer: BC Managed Care – PPO

## 2015-02-02 ENCOUNTER — Other Ambulatory Visit (INDEPENDENT_AMBULATORY_CARE_PROVIDER_SITE_OTHER): Payer: BC Managed Care – PPO

## 2015-02-02 ENCOUNTER — Other Ambulatory Visit: Payer: BC Managed Care – PPO

## 2015-02-02 DIAGNOSIS — Z1211 Encounter for screening for malignant neoplasm of colon: Secondary | ICD-10-CM

## 2015-02-02 LAB — COMPREHENSIVE METABOLIC PANEL
ALBUMIN: 4.1 g/dL (ref 3.5–5.2)
ALT: 16 U/L (ref 0–35)
AST: 21 U/L (ref 0–37)
Alkaline Phosphatase: 44 U/L (ref 39–117)
BILIRUBIN TOTAL: 0.5 mg/dL (ref 0.2–1.2)
BUN: 7 mg/dL (ref 6–23)
CALCIUM: 9.3 mg/dL (ref 8.4–10.5)
CO2: 30 mEq/L (ref 19–32)
CREATININE: 0.8 mg/dL (ref 0.40–1.20)
Chloride: 100 mEq/L (ref 96–112)
GFR: 99.42 mL/min (ref >60.00)
Glucose, Bld: 102 mg/dL — ABNORMAL HIGH (ref 70–99)
Potassium: 3.3 mEq/L — ABNORMAL LOW (ref 3.5–5.1)
SODIUM: 138 meq/L (ref 135–145)
TOTAL PROTEIN: 7.6 g/dL (ref 6.0–8.3)

## 2015-02-03 ENCOUNTER — Telehealth: Payer: Self-pay | Admitting: Medical

## 2015-02-03 DIAGNOSIS — E876 Hypokalemia: Secondary | ICD-10-CM

## 2015-02-03 LAB — FECAL OCCULT BLOOD, IMMUNOCHEMICAL: Fecal Occult Bld: NEGATIVE

## 2015-02-03 LAB — FECAL OCCULT BLOOD, GUAIAC: Fecal Occult Blood: NEGATIVE

## 2015-02-03 MED ORDER — POTASSIUM CHLORIDE CRYS ER 10 MEQ PO TBCR
EXTENDED_RELEASE_TABLET | ORAL | Status: DC
Start: 2015-02-03 — End: 2015-07-14

## 2015-02-03 NOTE — Telephone Encounter (Signed)
Repeat potassium level in 4-5 weeks. Please put in future cmp.

## 2015-02-04 NOTE — Telephone Encounter (Signed)
Future order entered on 02/04/15. Left message for pt to call back about results.

## 2015-03-11 ENCOUNTER — Other Ambulatory Visit: Payer: Self-pay | Admitting: Medical

## 2015-03-12 ENCOUNTER — Ambulatory Visit (INDEPENDENT_AMBULATORY_CARE_PROVIDER_SITE_OTHER): Payer: BC Managed Care – PPO | Admitting: Medical

## 2015-03-12 ENCOUNTER — Encounter: Payer: Self-pay | Admitting: Medical

## 2015-03-12 VITALS — BP 122/80 | HR 77 | Temp 98.1°F | Ht 66.5 in | Wt 224.2 lb

## 2015-03-12 DIAGNOSIS — J309 Allergic rhinitis, unspecified: Secondary | ICD-10-CM | POA: Diagnosis not present

## 2015-03-12 MED ORDER — FLUTICASONE PROPIONATE 50 MCG/ACT NA SUSP: 2.0000 | Freq: Every day | NASAL | Status: DC

## 2015-03-12 MED ORDER — LEVOCETIRIZINE DIHYDROCHLORIDE 5 MG PO TABS: 5.0000 mg | ORAL_TABLET | Freq: Every evening | ORAL | Status: DC

## 2015-03-12 NOTE — Progress Notes (Signed)
Subjective:    Patient ID: Christina Leblanc, female    DOB: 1970-04-02, 45 y.o.   MRN: [id number]  HPI  2 days of nasal congestion and runny nose, pnd and dry cough. Pt does have some spring allergy history.  Review of Systems  Constitutional: Negative for fever, chills and fatigue.  HENT: Positive for congestion, postnasal drip, rhinorrhea and sore throat. Negative for sinus pressure.        Scratchy throat.   Respiratory: Positive for cough. Negative for chest tightness, shortness of breath and wheezing.   Cardiovascular: Negative for chest pain and palpitations.  Gastrointestinal: Negative for abdominal pain.  Musculoskeletal: Negative for back pain.  Neurological: Negative for dizziness.  Hematological: Negative for adenopathy. Does not bruise/bleed easily.   Past Medical History  Diagnosis Date  . Hypertension   . Allergy   . Hyperlipidemia     Social History   Social History  . Marital Status: Divorced    Spouse Name: N/A  . Number of Children: N/A  . Years of Education: N/A   Occupational History  . Not on file.   Social History Main Topics  . Smoking status: Never Smoker   . Smokeless tobacco: Never Used  . Alcohol Use: 0.0 oz/week    0 Standard drinks or equivalent per week     Comment: wine occasionally .Twice.  . Drug Use: No  . Sexual Activity: Not on file   Other Topics Concern  . Not on file   Social History Narrative    Past Surgical History  Procedure Laterality Date  . Abdominal hysterectomy      2003    Family History  Problem Relation Age of Onset  . Diabetes Mother   . Diabetes Father   . Heart disease Father   . Cancer Brother   . Hypertension Brother     Allergies  Allergen Reactions  . Claritin [Loratadine]     Current Outpatient Prescriptions on File Prior to Visit  Medication Sig Dispense Refill  . acetaminophen (TYLENOL) 500 MG tablet Take 500 mg by mouth every 6 (six) hours as needed.    . hydrochlorothiazide  (HYDRODIURIL) 25 MG tablet Take 1 tablet (25 mg total) by mouth daily. 30 tablet 2  . NON FORMULARY     . potassium chloride (K-DUR,KLOR-CON) 10 MEQ tablet 1 tab po every other day 30 tablet 1   No current facility-administered medications on file prior to visit.    BP 122/80 mmHg  Pulse 77  Temp(Src) 98.1 F (36.7 C) (Oral)  Ht 5' 6.5" (1.689 m)  Wt 224 lb 3.2 oz (101.696 kg)  BMI 35.65 kg/m2  SpO2 98%       Objective:   Physical Exam  General  Mental Status - Alert. General Appearance - Well groomed. Not in acute distress.  Skin Rashes- No Rashes.  HEENT Head- Normal. Ear Auditory Canal - Left- Normal. Right - Normal.Tympanic Membrane- Left- Normal. Right- Normal. Eye Sclera/Conjunctiva- Left- Normal. Right- Normal. Nose & Sinuses Nasal Mucosa- Left-  Boggy and Congested. Right-  Boggy and  Congested.Bilateral  No maxillary and no frontal sinus pressure. Mouth & Throat Lips: Upper Lip- Normal: no dryness, cracking, pallor, cyanosis, or vesicular eruption. Lower Lip-Normal: no dryness, cracking, pallor, cyanosis or vesicular eruption. Buccal Mucosa- Bilateral- No Aphthous ulcers. Oropharynx- No Discharge or Erythema. +pnd. Tonsils: Characteristics- Bilateral- No Erythema or Congestion. Size/Enlargement- Bilateral- No enlargement. Discharge- bilateral-None.  Neck Neck- Supple. No Masses.   Chest and Lung Exam  Auscultation: Breath Sounds:-Clear even and unlabored.  Cardiovascular Auscultation:Rythm- Regular, rate and rhythm. Murmurs & Other Heart Sounds:Ausculatation of the heart reveal- No Murmurs.  Lymphatic Head & Neck General Head & Neck Lymphatics: Bilateral: Description- No Localized lymphadenopathy.       Assessment & Plan:  For allergies will rx xyzal and flonase. Providing refills as you may need these through spring.  If any worse or changing symptoms notify us.  Follow up 7 days or as needed

## 2015-03-12 NOTE — Patient Instructions (Addendum)
For allergies will rx xyzal and flonase. Providing refills as you may need these through spring.  If any worse or changing symptoms notify us.  Follow up 7 days or as needed

## 2015-03-12 NOTE — Progress Notes (Signed)
Pre visit review using our clinic review tool, if applicable. No additional management support is needed unless otherwise documented below in the visit note. 

## 2015-03-13 NOTE — Telephone Encounter (Signed)
PT DID NOT NEED CEFDNIR YESTERDAY. SHE ONLY HAD ALLERGY SYMPTOMS. DID SOMETHING CHANGE SINCE YESTERDAY. IF NO CHANGE THEN NO RX. IF ALL THE SUDDEN SEVERE SINUS PAIN THEN CAN REFILL

## 2015-03-13 NOTE — Telephone Encounter (Signed)
Edward please advise if pt will need a refill on CEFDINIR 300MG  CAP.

## 2015-03-16 ENCOUNTER — Telehealth: Payer: Self-pay

## 2015-03-16 NOTE — Telephone Encounter (Addendum)
[facility] note received via fax below:  Date: 03/14/15   Time:  8:08:29  Caller:  Patient Nurse:  Conception Oms  Chief complaint:  Cough Initial comment:  Caller states she was prescribed cold meds, not helping cough  Reason for call:  Has a cough and a cold.  Prescribed medication is loosening things up and needs something to dry her up.  No fever.  Has a cough.  Disposition: Send to [facility]  Comments: Caller wanted to speak supervisor.  Gave home care advice for cold and cough.  Told caller if she felt like she needed more that that, she would have to be seen and gave options.

## 2015-03-16 NOTE — Telephone Encounter (Signed)
Called to follow up with patient.  Left a message for call back.   

## 2015-03-20 ENCOUNTER — Telehealth: Payer: Self-pay | Admitting: Medical

## 2015-03-20 MED ORDER — BENZONATATE 100 MG PO CAPS: 100.0000 mg | ORAL_CAPSULE | Freq: Three times a day (TID) | ORAL | Status: DC | PRN

## 2015-03-20 NOTE — Telephone Encounter (Signed)
Please advise if pt will need to be seen for an OV.

## 2015-03-20 NOTE — Telephone Encounter (Signed)
Left message that pt could get a Rx for cough and to call back to set up an appointment for Monday if she was not feeling better and the cough had gotten worse.

## 2015-03-20 NOTE — Telephone Encounter (Signed)
Relation to [id number] Call back [place]: [facility] [place], [place] [phone] (Phone) [phone] (Fax)         Reason for call:  Past last seen 03/12/2015 patient states cough worsened requesting a Rx

## 2015-03-20 NOTE — Telephone Encounter (Signed)
I saw her about 10 days ago.(has worse cough) I could rx benzonatate. I can see her today or on Monday. Offer her appointment and document she was offered.

## 2015-03-26 ENCOUNTER — Telehealth: Payer: Self-pay | Admitting: Medical

## 2015-03-26 ENCOUNTER — Encounter: Payer: BC Managed Care – PPO | Admitting: Medical

## 2015-03-26 NOTE — Telephone Encounter (Signed)
Pt lvm at 4:09 cancelling appt. She says that she cant make her appt.

## 2015-03-26 NOTE — Progress Notes (Signed)
This encounter was created in error - please disregard.

## 2015-03-26 NOTE — Telephone Encounter (Signed)
No Charge per pcp.

## 2015-03-26 NOTE — Telephone Encounter (Signed)
Charge if she has more than 2 no shows

## 2015-03-26 NOTE — Telephone Encounter (Signed)
Edward please advise.  

## 2015-05-26 ENCOUNTER — Other Ambulatory Visit: Payer: Self-pay | Admitting: Medical

## 2015-06-30 ENCOUNTER — Other Ambulatory Visit: Payer: Self-pay | Admitting: Family Medicine

## 2015-07-13 ENCOUNTER — Encounter (HOSPITAL_BASED_OUTPATIENT_CLINIC_OR_DEPARTMENT_OTHER): Payer: Self-pay | Admitting: Emergency Medicine

## 2015-07-13 ENCOUNTER — Telehealth: Payer: Self-pay | Admitting: Medical

## 2015-07-13 ENCOUNTER — Emergency Department (HOSPITAL_BASED_OUTPATIENT_CLINIC_OR_DEPARTMENT_OTHER)
Admission: EM | Admit: 2015-07-13 | Discharge: 2015-07-13 | Disposition: A | Discharge status: Home / Self Care | Payer: BC Managed Care – PPO | Attending: Emergency Medicine | Admitting: Emergency Medicine

## 2015-07-13 ENCOUNTER — Other Ambulatory Visit: Payer: Self-pay

## 2015-07-13 DIAGNOSIS — R42 Dizziness and giddiness: Secondary | ICD-10-CM

## 2015-07-13 DIAGNOSIS — R110 Nausea: Secondary | ICD-10-CM

## 2015-07-13 DIAGNOSIS — E876 Hypokalemia: Secondary | ICD-10-CM

## 2015-07-13 DIAGNOSIS — I10 Essential (primary) hypertension: Secondary | ICD-10-CM | POA: Diagnosis not present

## 2015-07-13 DIAGNOSIS — R109 Unspecified abdominal pain: Secondary | ICD-10-CM | POA: Diagnosis not present

## 2015-07-13 DIAGNOSIS — R112 Nausea with vomiting, unspecified: Secondary | ICD-10-CM | POA: Insufficient documentation

## 2015-07-13 DIAGNOSIS — E785 Hyperlipidemia, unspecified: Secondary | ICD-10-CM | POA: Insufficient documentation

## 2015-07-13 LAB — COMPREHENSIVE METABOLIC PANEL
ALBUMIN: 4.3 g/dL (ref 3.5–5.0)
ALK PHOS: 50 U/L (ref 38–126)
ALT: 24 U/L (ref 14–54)
ANION GAP: 9 (ref 5–15)
AST: 30 U/L (ref 15–41)
BILIRUBIN TOTAL: 0.4 mg/dL (ref 0.3–1.2)
BUN: 8 mg/dL (ref 6–20)
CALCIUM: 9.3 mg/dL (ref 8.9–10.3)
CO2: 30 mmol/L (ref 22–32)
CREATININE: 0.84 mg/dL (ref 0.44–1.00)
Chloride: 96 mmol/L — ABNORMAL LOW (ref 101–111)
GFR calc Af Amer: >60 mL/min (ref >60)
GFR calc non Af Amer: >60 mL/min (ref >60)
GLUCOSE: 161 mg/dL — AB (ref 65–99)
Potassium: 2.7 mmol/L — CL (ref 3.5–5.1)
Sodium: 135 mmol/L (ref 135–145)
TOTAL PROTEIN: 8.1 g/dL (ref 6.5–8.1)

## 2015-07-13 LAB — CBC WITH DIFFERENTIAL/PLATELET
BASOS PCT: 0 %
Basophils Absolute: 0 10*3/uL (ref 0.0–0.1)
Eosinophils Absolute: 0 10*3/uL (ref 0.0–0.7)
Eosinophils Relative: 0 %
HEMATOCRIT: 40.1 % (ref 36.0–46.0)
HEMOGLOBIN: 13.2 g/dL (ref 12.0–15.0)
Lymphocytes Relative: 24 %
Lymphs Abs: 1.7 10*3/uL (ref 0.7–4.0)
MCH: 28.1 pg (ref 26.0–34.0)
MCHC: 32.9 g/dL (ref 30.0–36.0)
MCV: 85.5 fL (ref 78.0–100.0)
MONOS PCT: 7 %
Monocytes Absolute: 0.5 10*3/uL (ref 0.1–1.0)
NEUTROS ABS: 4.9 10*3/uL (ref 1.7–7.7)
NEUTROS PCT: 69 %
Platelets: 394 10*3/uL (ref 150–400)
RBC: 4.69 MIL/uL (ref 3.87–5.11)
RDW: 14.5 % (ref 11.5–15.5)
WBC: 7.1 10*3/uL (ref 4.0–10.5)

## 2015-07-13 LAB — URINALYSIS, ROUTINE W REFLEX MICROSCOPIC
BILIRUBIN URINE: NEGATIVE
GLUCOSE, UA: NEGATIVE mg/dL
HGB URINE DIPSTICK: NEGATIVE
KETONES UR: NEGATIVE mg/dL
Leukocytes, UA: NEGATIVE
Nitrite: NEGATIVE
PH: 7.5 (ref 5.0–8.0)
Protein, ur: NEGATIVE mg/dL
Specific Gravity, Urine: 1.006 (ref 1.005–1.030)

## 2015-07-13 LAB — LIPASE, BLOOD: Lipase: 30 U/L (ref 11–51)

## 2015-07-13 LAB — MAGNESIUM: Magnesium: 1.9 mg/dL (ref 1.7–2.4)

## 2015-07-13 MED ORDER — POTASSIUM CHLORIDE CRYS ER 20 MEQ PO TBCR
40.0000 meq | EXTENDED_RELEASE_TABLET | Freq: Once | ORAL | Status: AC
  Administered 2015-07-13: 40 meq via ORAL
  Filled 2015-07-13: qty 2

## 2015-07-13 MED ORDER — SODIUM CHLORIDE 0.9 % IV BOLUS (SEPSIS)
1000.0000 mL | Freq: Once | INTRAVENOUS | Status: AC
  Administered 2015-07-13: 1000 mL via INTRAVENOUS

## 2015-07-13 MED ORDER — POTASSIUM CHLORIDE 10 MEQ/100ML IV SOLN
10.0000 meq | Freq: Once | INTRAVENOUS | Status: AC
  Administered 2015-07-13: 10 meq via INTRAVENOUS
  Filled 2015-07-13: qty 100

## 2015-07-13 MED ORDER — ONDANSETRON HCL 4 MG/2ML IJ SOLN: 4.0000 mg | Freq: Once | INTRAMUSCULAR | Status: DC

## 2015-07-13 MED ORDER — POTASSIUM CHLORIDE ER 10 MEQ PO TBCR: 20.0000 meq | EXTENDED_RELEASE_TABLET | Freq: Two times a day (BID) | ORAL | Status: DC

## 2015-07-13 NOTE — ED Notes (Addendum)
Patient reports that she started to have dizziness and nausea starting this Am. The patient reports that she is lightheaded as well as dizzy, reports that her right pinky finger is tingling.

## 2015-07-13 NOTE — ED Notes (Signed)
Pt primary nurse, Almyra Free Cothren notified of K+ 2.7

## 2015-07-13 NOTE — Telephone Encounter (Signed)
Patient Name: KENZLIE CERA  DOB: August 19, 1970    Initial Comment Caller states c/o she doesn't feel like herself. She could not provide any specific symptoms that she is feeling right now but did say she felt dizzy and nauseous this morning.   Nurse Assessment  Nurse: Raphael Gibney, RN, [place] Date/Time (Eastern Time): 07/13/2015 1:08:11 PM  Confirm and document reason for call. If symptomatic, describe symptoms. You must click the next button to save text entered. ---Caller states she does not feel like herself today. She was dizzy this am. She has been nauseated. No headache and no pain anywhere. She has been drinking fluids. Has had 2 BMs today.  Has the patient traveled out of the country within the last 30 days? ---Not Applicable  Does the patient have any new or worsening symptoms? ---Yes  Will a triage be completed? ---Yes  Related visit to physician within the last 2 weeks? ---No  Does the PT have any chronic conditions? (i.e. diabetes, asthma, etc.) ---Yes  List chronic conditions. ---HTN  Is the patient pregnant or possibly pregnant? (Ask all females between the ages of 41-55) ---No  Is this a behavioral health or substance abuse call? ---No     Guidelines    Guideline Title Affirmed Question Affirmed Notes  Dizziness - Lightheadedness Taking a medicine that could cause dizziness (e.g., blood pressure medications, diuretics)    Final Disposition User   See Physician within 24 Hours Raphael Gibney, RN, Vera    Comments  Appt scheduled for 07/14/2015 at 11 am with Mackie Pai   Referrals  REFERRED TO PCP OFFICE   Disagree/Comply: Leta Baptist

## 2015-07-13 NOTE — Telephone Encounter (Signed)
Pt called in to schedule an appt. Pt expressed experiencing some dizziness. Transferred pt to [facility] for triaging.

## 2015-07-13 NOTE — ED Notes (Signed)
Up to b/r, steady gait, denies dizziness, or other sx.

## 2015-07-13 NOTE — ED Notes (Signed)
Pt up to b/r with steady gait, denies dizziness, "feel better".

## 2015-07-13 NOTE — ED Provider Notes (Signed)
CSN: [id number]     Arrival date & time 07/13/15  1408 History  By signing my name below, I, Christina Leblanc, attest that this documentation has been prepared under the direction and in the presence of Christina Morgan, MD. Electronically Signed: [name], ED Scribe. 07/13/2015. 4:10 PM.   Chief Complaint  Patient presents with  . Dizziness   The history is provided by the patient. No language interpreter was used.    HPI Comments: Christina Leblanc is a 45 y.o. female with a PMHx of HTN and HLD who presents to the Emergency Department intermittent, mild, dizziness onset this morning. She states that she was feeling dizzy for about 2-3 minutes this morning, got up and ate liver pudding for the "first time in a long time" and began experiencing nausea. Pt also reports last night and this morning that she was feeling dehydrated after being out in the sun all day for a relatives birthday, so after feeling nauseated this morning began to eat fruit and drink water which made her feel better stating "I still wasn't quite myself". She states her dizziness was mildly alleviated by sitting and denies any worsening factors. She reports mild suprapubic pain she describes as "cramping" this morning and then states she had 2x BMs this morning with the first being nml and the second loose with her abd pain alleviating s/p her BMs.  Pt additionally reports associated, mild, tingling in 2-3 fingers of her right hand for 2-3 hours which alleviated just PTA. Pt reports that her symptoms have mostly alleviated. Pt denies fatigue, lightheadedness, facial drooping, weakness, difficulty speaking, slurred speech, difficulty ambulating, CP, SOB, ear pain, congestion, black stools, bloody stools, fever, chills, dysuria, HA and rash.   Past Medical History  Diagnosis Date  . Hypertension   . Allergy   . Hyperlipidemia    Past Surgical History  Procedure Laterality Date  . Abdominal hysterectomy      2003   Family  History  Problem Relation Age of Onset  . Diabetes Mother   . Diabetes Father   . Heart disease Father   . Cancer Brother   . Hypertension Brother    Social History  Substance Use Topics  . Smoking status: Never Smoker   . Smokeless tobacco: Never Used  . Alcohol Use: 0.0 oz/week    0 Standard drinks or equivalent per week     Comment: wine occasionally .Twice.   OB History    No data available     Review of Systems  Constitutional: Negative for fever, chills and fatigue.  HENT: Negative for congestion, ear pain and sore throat.   Eyes: Negative for visual disturbance.  Respiratory: Negative for cough and shortness of breath.   Cardiovascular: Negative for chest pain.  Gastrointestinal: Positive for nausea and abdominal pain. Negative for constipation and blood in stool.  Genitourinary: Negative for dysuria and difficulty urinating.  Musculoskeletal: Negative for back pain and neck pain.  Skin: Negative for rash.  Neurological: Positive for dizziness. Negative for syncope, weakness, light-headedness, numbness and headaches.  All other systems reviewed and are negative.  Allergies  Claritin  Home Medications   Prior to Admission medications   Medication Sig Start Date End Date Taking? Authorizing Provider  acetaminophen (TYLENOL) 500 MG tablet Take 500 mg by mouth every 6 (six) hours as needed.    Historical Provider, MD  hydrochlorothiazide (HYDRODIURIL) 25 MG tablet TAKE ONE TABLET BY MOUTH ONCE DAILY 07/01/15   Darreld Mclean, MD  levocetirizine (XYZAL) 5 MG tablet Take 1 tablet (5 mg total) by mouth every evening. 03/12/15   Mackie Pai, PA-C  NON FORMULARY     Historical Provider, MD  potassium chloride (K-DUR) 10 MEQ tablet Take 2 tablets (20 mEq total) by mouth 2 (two) times daily. 07/13/15 07/16/15  Christina Morgan, MD  potassium chloride (K-DUR,KLOR-CON) 10 MEQ tablet 1 tab po every other day 07/14/15   Mackie Pai, PA-C   BP 141/82 mmHg  Pulse 89   Temp(Src) 98.9 F (37.2 C) (Oral)  Resp 18  Ht 5\' 7"  (1.702 m)  Wt 218 lb (98.884 kg)  BMI 34.14 kg/m2  SpO2 100%    Physical Exam  Constitutional: She is oriented to person, place, and time. She appears well-developed and well-nourished.  HENT:  Head: Normocephalic and atraumatic.  Eyes: EOM are normal.  Neck: Normal range of motion.  Cardiovascular: Normal rate.   Pulmonary/Chest: Effort normal. No respiratory distress.  Abdominal: Soft.  Musculoskeletal: Normal range of motion.  Neurological: She is alert and oriented to person, place, and time. No cranial nerve deficit or sensory deficit. Coordination normal.  Skin: Skin is warm and dry.  Psychiatric: She has a normal mood and affect.  Nursing note and vitals reviewed.   ED Course  Procedures  DIAGNOSTIC STUDIES: Oxygen Saturation is 100% on RA, normal by my interpretation.    COORDINATION OF CARE: 3:49 PM Discussed next steps with pt. Pt verbalized understanding and is agreeable with the plan.   Labs Review Labs Reviewed  COMPREHENSIVE METABOLIC PANEL - Abnormal; Notable for the following:    Potassium 2.7 (*)    Chloride 96 (*)    Glucose, Bld 161 (*)    All other components within normal limits  CBC WITH DIFFERENTIAL/PLATELET  LIPASE, BLOOD  URINALYSIS, ROUTINE W REFLEX MICROSCOPIC (NOT AT [facility])  MAGNESIUM    Imaging Review No results found. I have personally reviewed and evaluated these images and lab results as part of my medical decision-making.   EKG Interpretation   Date/Time:  Monday July 13 2015 14:25:09 EDT Ventricular Rate:  93 PR Interval:    QRS Duration: 91 QT Interval:  377 QTC Calculation: 469 R Axis:   16 Text Interpretation:  Sinus rhythm Abnormal R-wave progression, early  transition Baseline wander in lead(s) II III aVF V1 V2 V3 V4 V5 No  previous ECGs available Confirmed by [facility] MD, Livi Mcgann ([id number]) on  07/13/2015 4:36:32 PM      MDM   Final diagnoses:  Dizziness   Nausea  Hypokalemia   45 year old female with a history of hypertension, hyperlipidemia presents with concern for episode of dizziness, nausea, vomiting and loose stool this morning. Patient denies any neurologic symptoms other than that of tingling of fingers on the right side on radial side of hand, and given distribution suspect likely secondary to other peripheral nerve abnormality, no other neurologic symptoms, including normal gait, normal coordination, normal strength, and her neurologic exam is normal at this time. Have low suspicion for CVA/TIA.  No chest pain or shortness of breath and doubt ACS or PE. EKG was done which showed no acute abnormalities.  Labs obtained showing normal hemoglobin.  Pt with hypokalemia of 2.7, given 1meq po potassium, 87meq IV potassium. Mg WNL Recommend taking another 20 meq po potassium tonight followed by 20 BID and follow up in 2 days with PCP.   Urinalysis shows no sign of urinary tract infection. Patient was given IV fluids and K.  Possible etiologies for symptoms may be dehydration, hypokalemia or related to food this AM.  Patient is back to baseline. Recommend close PCP follow up and recheck of K this week.   I personally performed the services described in this documentation, which was scribed in my presence. The recorded information has been reviewed and is accurate.   Christina Morgan, MD 07/14/15 1258

## 2015-07-13 NOTE — ED Notes (Signed)
Pt alert, NAD, calm, interactive, resps e/u, speaking in clear complete sentences, (denies: pain, sob, nausea, dizziness or other sx), "feel better".

## 2015-07-14 ENCOUNTER — Ambulatory Visit (INDEPENDENT_AMBULATORY_CARE_PROVIDER_SITE_OTHER): Payer: BC Managed Care – PPO | Admitting: Medical

## 2015-07-14 ENCOUNTER — Encounter: Payer: Self-pay | Admitting: Medical

## 2015-07-14 VITALS — BP 122/80 | HR 87 | Temp 97.9°F | Ht 66.5 in | Wt 224.0 lb

## 2015-07-14 DIAGNOSIS — R739 Hyperglycemia, unspecified: Secondary | ICD-10-CM | POA: Diagnosis not present

## 2015-07-14 DIAGNOSIS — E876 Hypokalemia: Secondary | ICD-10-CM | POA: Diagnosis not present

## 2015-07-14 DIAGNOSIS — I10 Essential (primary) hypertension: Secondary | ICD-10-CM | POA: Diagnosis not present

## 2015-07-14 MED ORDER — POTASSIUM CHLORIDE CRYS ER 10 MEQ PO TBCR: EXTENDED_RELEASE_TABLET | ORAL | Status: DC

## 2015-07-14 NOTE — Progress Notes (Signed)
Pre visit review using our clinic review tool, if applicable. No additional management support is needed unless otherwise documented below in the visit note. 

## 2015-07-14 NOTE — Telephone Encounter (Signed)
Appt with Percell Miller noted.

## 2015-07-14 NOTE — Patient Instructions (Addendum)
Your dizziness and nausea may have been related to eating the liver pudding. You may have been some dehydrated as well.  For your htn continue hctz.  For you low k continue current k tabs that ED wrote. When you are finished with that then start k-dur 10 meq 1 tab every other day. Then repeat cmp in 2 weeks. Also at that time will get a1-c to check average sugar level. Go ahead and start low sugar diet.   Follow up in 2 weeks or as needed

## 2015-07-14 NOTE — Progress Notes (Signed)
Subjective:    Patient ID: Juanda Bond, female    DOB: [birth year], 45 y.o.   MRN: [id number]  HPI   Pt in today for follow up from the ED.   Pt states after she ate some liver pudding yesterday morning. She had some nausea afterwards. 2 loose stools yesterday. None since then.  Pt thought she may have been dehydrated as well. The ED did give her some IV hydration.   Pt had some intermittent dizziness yesterday before she went to ED. By time she was in ED it had resolved.  Pt had some low potassium recently. She is on hctz. She left ED with rx of 10 meq 2 tab po bid.  Now feeling almost back to normal. In fact she states feel back to normal.   Review of Systems  Constitutional: Negative for chills and fatigue.  Respiratory: Negative for chest tightness, shortness of breath and wheezing.   Cardiovascular: Negative for chest pain and palpitations.  Neurological: Negative for dizziness and light-headedness.  Hematological: Negative for adenopathy. Does not bruise/bleed easily.  Psychiatric/Behavioral: Negative for behavioral problems and confusion.   Past Medical History  Diagnosis Date  . Hypertension   . Allergy   . Hyperlipidemia      Social History   Social History  . Marital Status: Divorced    Spouse Name: N/A  . Number of Children: N/A  . Years of Education: N/A   Occupational History  . Not on file.   Social History Main Topics  . Smoking status: Never Smoker   . Smokeless tobacco: Never Used  . Alcohol Use: 0.0 oz/week    0 Standard drinks or equivalent per week     Comment: wine occasionally .Twice.  . Drug Use: No  . Sexual Activity: Not on file   Other Topics Concern  . Not on file   Social History Narrative    Past Surgical History  Procedure Laterality Date  . Abdominal hysterectomy      2003    Family History  Problem Relation Age of Onset  . Diabetes Mother   . Diabetes Father   . Heart disease Father   . Cancer Brother   .  Hypertension Brother     Allergies  Allergen Reactions  . Claritin [Loratadine]     Current Outpatient Prescriptions on File Prior to Visit  Medication Sig Dispense Refill  . acetaminophen (TYLENOL) 500 MG tablet Take 500 mg by mouth every 6 (six) hours as needed.    . hydrochlorothiazide (HYDRODIURIL) 25 MG tablet TAKE ONE TABLET BY MOUTH ONCE DAILY 30 tablet 0  . levocetirizine (XYZAL) 5 MG tablet Take 1 tablet (5 mg total) by mouth every evening. 30 tablet 2  . NON FORMULARY     . potassium chloride (K-DUR) 10 MEQ tablet Take 2 tablets (20 mEq total) by mouth 2 (two) times daily. 8 tablet 0   No current facility-administered medications on file prior to visit.    BP 122/80 mmHg  Pulse 87  Temp(Src) 97.9 F (36.6 C) (Oral)  Ht 5' 6.5" (1.689 m)  Wt 224 lb (101.606 kg)  BMI 35.62 kg/m2  SpO2 97%       Objective:   Physical Exam   General Mental Status- Alert. General Appearance- Not in acute distress.   Skin General: Color- Normal Color. Moisture- Normal Moisture.  Neck Carotid Arteries- Normal color. Moisture- Normal Moisture. No carotid bruits. No JVD.  Chest and Lung Exam Auscultation: Breath Sounds:-Normal.  Cardiovascular Auscultation:Rythm- Regular. Murmurs & Other Heart Sounds:Auscultation of the heart reveals- No Murmurs. . Neurologic Cranial Nerve exam:- CN III-XII intact(No nystagmus), symmetric smile. Strength:- 5/5 equal and symmetric strength both upper and lower extremities.     Assessment & Plan:  Your dizziness and nausea may have been related to eating the liver pudding. You may have been some dehydrated as well.  For your htn continue hctz.  For you low k continue current k tabs that ED wrote. When you are finished with that then start k-dur 10 meq 1 tab every other day. Then repeat cmp in 3 weeks. Also at that time will get a1-c to check average sugar level. Go ahead and start low sugar diet.   Follow up in 3 weeks or as  needed  Zylen Wenig, Percell Miller, [employer]

## 2015-07-27 ENCOUNTER — Ambulatory Visit: Payer: BC Managed Care – PPO | Admitting: Medical

## 2015-07-29 ENCOUNTER — Other Ambulatory Visit: Payer: Self-pay | Admitting: Family Medicine

## 2015-07-30 ENCOUNTER — Encounter: Payer: Self-pay | Admitting: Medical

## 2015-07-30 ENCOUNTER — Ambulatory Visit (INDEPENDENT_AMBULATORY_CARE_PROVIDER_SITE_OTHER): Payer: BC Managed Care – PPO | Admitting: Medical

## 2015-07-30 VITALS — BP 124/84 | HR 77 | Temp 98.1°F | Ht 66.5 in | Wt 221.4 lb

## 2015-07-30 DIAGNOSIS — R739 Hyperglycemia, unspecified: Secondary | ICD-10-CM | POA: Diagnosis not present

## 2015-07-30 DIAGNOSIS — I10 Essential (primary) hypertension: Secondary | ICD-10-CM

## 2015-07-30 DIAGNOSIS — E876 Hypokalemia: Secondary | ICD-10-CM

## 2015-07-30 DIAGNOSIS — E785 Hyperlipidemia, unspecified: Secondary | ICD-10-CM | POA: Diagnosis not present

## 2015-07-30 LAB — COMPREHENSIVE METABOLIC PANEL
ALBUMIN: 4.3 g/dL (ref 3.5–5.2)
ALT: 12 U/L (ref 0–35)
AST: 17 U/L (ref 0–37)
Alkaline Phosphatase: 50 U/L (ref 39–117)
BUN: 11 mg/dL (ref 6–23)
CO2: 35 mEq/L — ABNORMAL HIGH (ref 19–32)
Calcium: 9.6 mg/dL (ref 8.4–10.5)
Chloride: 104 mEq/L (ref 96–112)
Creatinine, Ser: 0.94 mg/dL (ref 0.40–1.20)
GFR: 82.36 mL/min (ref >60.00)
Glucose, Bld: 116 mg/dL — ABNORMAL HIGH (ref 70–99)
POTASSIUM: 3.3 meq/L — AB (ref 3.5–5.1)
SODIUM: 130 meq/L — AB (ref 135–145)
Total Bilirubin: 0.4 mg/dL (ref 0.2–1.2)
Total Protein: 7.9 g/dL (ref 6.0–8.3)

## 2015-07-30 LAB — CBC WITH DIFFERENTIAL/PLATELET
BASOS PCT: 0.4 % (ref 0.0–3.0)
Basophils Absolute: 0 10*3/uL (ref 0.0–0.1)
EOS ABS: 0 10*3/uL (ref 0.0–0.7)
EOS PCT: 0.4 % (ref 0.0–5.0)
HCT: 41.5 % (ref 36.0–46.0)
Hemoglobin: 13.4 g/dL (ref 12.0–15.0)
LYMPHS ABS: 2.3 10*3/uL (ref 0.7–4.0)
Lymphocytes Relative: 41.5 % (ref 12.0–46.0)
MCHC: 32.3 g/dL (ref 30.0–36.0)
MCV: 84.8 fl (ref 78.0–100.0)
MONO ABS: 0.5 10*3/uL (ref 0.1–1.0)
Monocytes Relative: 8.8 % (ref 3.0–12.0)
NEUTROS ABS: 2.7 10*3/uL (ref 1.4–7.7)
NEUTROS PCT: 48.9 % (ref 43.0–77.0)
PLATELETS: 356 10*3/uL (ref 150.0–400.0)
RBC: 4.9 Mil/uL (ref 3.87–5.11)
RDW: 15 % (ref 11.5–15.5)
WBC: 5.5 10*3/uL (ref 4.0–10.5)

## 2015-07-30 LAB — LIPID PANEL
CHOL/HDL RATIO: 5
CHOLESTEROL: 248 mg/dL — AB (ref 0–200)
HDL: 45.9 mg/dL (ref >39.00)
LDL CALC: 183 mg/dL — AB (ref 0–99)
NonHDL: 201.86
Triglycerides: 92 mg/dL (ref 0.0–149.0)
VLDL: 18.4 mg/dL (ref 0.0–40.0)

## 2015-07-30 LAB — HEMOGLOBIN A1C: HEMOGLOBIN A1C: 6.4 % (ref 4.6–6.5)

## 2015-07-30 MED ORDER — POTASSIUM CHLORIDE CRYS ER 10 MEQ PO TBCR: 10.0000 meq | EXTENDED_RELEASE_TABLET | Freq: Every day | ORAL | Status: DC

## 2015-07-30 MED ORDER — ROSUVASTATIN CALCIUM 10 MG PO TABS: 10.0000 mg | ORAL_TABLET | Freq: Every day | ORAL | Status: DC

## 2015-07-30 MED ORDER — METFORMIN HCL 500 MG PO TABS: 500.0000 mg | ORAL_TABLET | Freq: Two times a day (BID) | ORAL | Status: DC

## 2015-07-30 MED ORDER — HYDROCHLOROTHIAZIDE 25 MG PO TABS: 25.0000 mg | ORAL_TABLET | Freq: Every day | ORAL | Status: DC

## 2015-07-30 NOTE — Progress Notes (Signed)
Subjective:    Patient ID: Christina Leblanc, female    DOB: [birth year], 45 y.o.   MRN: [id number]  HPI  Pt in for follow up. She is having a good summer.  Pt bp week after last visit and her levels was 123/96. No cardiac or neurologic signs or symptoms.  Pt k on last visit was 2.7. Pt is on hctz for bp. She has been on k-dur 10 meq every other day.(pt states other meds she used did not work ex lisinopril)   Pt sugar on last visit was 161. In past has been mild high. She drank lemonade last day labs were drawn. Pt has been eating low sugar diet since last seen. Pt has lost 4 lbs since last visit. She is exercising. 3 times a week. She is walking fast pace on treadmill.  Pt had elevated cholesterol in December. Pt has not got it rechecked since. On no med presently. Elevated in past. Never has been on statin.     Review of Systems  Constitutional: Negative for fever, chills and fatigue.  Respiratory: Negative for cough, shortness of breath and wheezing.   Cardiovascular: Negative for chest pain and palpitations.  Gastrointestinal: Negative for nausea, abdominal pain, constipation and blood in stool.  Genitourinary: Negative for dysuria, urgency and frequency.  Musculoskeletal: Negative for myalgias and back pain.  Skin: Negative for rash.  Neurological: Negative for dizziness, speech difficulty and headaches.  Psychiatric/Behavioral: Negative for behavioral problems and confusion.    Past Medical History  Diagnosis Date  . Hypertension   . Allergy   . Hyperlipidemia      Social History   Social History  . Marital Status: Divorced    Spouse Name: N/A  . Number of Children: N/A  . Years of Education: N/A   Occupational History  . Not on file.   Social History Main Topics  . Smoking status: Never Smoker   . Smokeless tobacco: Never Used  . Alcohol Use: 0.0 oz/week    0 Standard drinks or equivalent per week     Comment: wine occasionally .Twice.  . Drug Use: No    . Sexual Activity: Not on file   Other Topics Concern  . Not on file   Social History Narrative    Past Surgical History  Procedure Laterality Date  . Abdominal hysterectomy      2003    Family History  Problem Relation Age of Onset  . Diabetes Mother   . Diabetes Father   . Heart disease Father   . Cancer Brother   . Hypertension Brother     Allergies  Allergen Reactions  . Claritin [Loratadine]     Current Outpatient Prescriptions on File Prior to Visit  Medication Sig Dispense Refill  . acetaminophen (TYLENOL) 500 MG tablet Take 500 mg by mouth every 6 (six) hours as needed.    . hydrochlorothiazide (HYDRODIURIL) 25 MG tablet TAKE ONE TABLET BY MOUTH ONCE DAILY 30 tablet 0  . levocetirizine (XYZAL) 5 MG tablet Take 1 tablet (5 mg total) by mouth every evening. 30 tablet 2  . NON FORMULARY     . potassium chloride (K-DUR,KLOR-CON) 10 MEQ tablet 1 tab po every other day 30 tablet 1   No current facility-administered medications on file prior to visit.    BP 124/84 mmHg  Pulse 77  Temp(Src) 98.1 F (36.7 C) (Oral)  Ht 5' 6.5" (1.689 m)  Wt 221 lb 6.4 oz (100.426 kg)  BMI 35.20 kg/m2  SpO2 98%       Objective:   Physical Exam  General Mental Status- Alert. General Appearance- Not in acute distress.   Skin General: Color- Normal Color. Moisture- Normal Moisture.  Neck Carotid Arteries- Normal color. Moisture- Normal Moisture. No carotid bruits. No JVD.  Chest and Lung Exam Auscultation: Breath Sounds:-Normal.  Cardiovascular Auscultation:Rythm- Regular. Murmurs & Other Heart Sounds:Auscultation of the heart reveals- No Murmurs.  Abdomen Inspection:-Inspeection Normal. Palpation/Percussion:Note:No mass. Palpation and Percussion of the abdomen reveal- Non Tender, Non Distended + BS, no rebound or guarding.   Neurologic Cranial Nerve exam:- CN III-XII intact(No nystagmus), symmetric smile. Strength:- 5/5 equal and symmetric strength both  upper and lower extremities.      Assessment & Plan:  For your htn will continue hctz 25 mg.  For low k will assess k level. Determine what level of supplementation likley needed.  For high sugar get a1c today. Continue low dose diet and exercise.  For high cholesterol will check level today. See if you may need rx med.  Follow up date to be determined after lab results back.(probable 3 months)  Toneka Fullen, Percell Miller, PA-C

## 2015-07-30 NOTE — Patient Instructions (Addendum)
For your htn will continue hctz 25 mg.  For low k will assess k level. Determine what level of supplementation likley needed.  For high sugar get a1c today. Continue low dose diet and exercise.  For high cholesterol will check level today. See if you may need rx med.  Follow up date to be determined after lab results back.(probable 3 months)

## 2015-07-30 NOTE — Progress Notes (Signed)
Pre visit review using our clinic review tool, if applicable. No additional management support is needed unless otherwise documented below in the visit note. 

## 2015-07-31 NOTE — Addendum Note (Signed)
Addended by: Tasia Catchings on: 07/31/2015 12:54 PM   Modules accepted: Orders

## 2015-09-30 ENCOUNTER — Telehealth: Payer: Self-pay | Admitting: Medical

## 2015-09-30 ENCOUNTER — Other Ambulatory Visit (INDEPENDENT_AMBULATORY_CARE_PROVIDER_SITE_OTHER): Payer: BC Managed Care – PPO

## 2015-09-30 DIAGNOSIS — E785 Hyperlipidemia, unspecified: Secondary | ICD-10-CM

## 2015-09-30 DIAGNOSIS — I10 Essential (primary) hypertension: Secondary | ICD-10-CM

## 2015-09-30 DIAGNOSIS — R739 Hyperglycemia, unspecified: Secondary | ICD-10-CM

## 2015-09-30 DIAGNOSIS — E876 Hypokalemia: Secondary | ICD-10-CM | POA: Diagnosis not present

## 2015-09-30 LAB — COMPREHENSIVE METABOLIC PANEL
ALT: 15 U/L (ref 0–35)
AST: 20 U/L (ref 0–37)
Albumin: 4.4 g/dL (ref 3.5–5.2)
Alkaline Phosphatase: 52 U/L (ref 39–117)
BUN: 12 mg/dL (ref 6–23)
CO2: 32 meq/L (ref 19–32)
CREATININE: 0.84 mg/dL (ref 0.40–1.20)
Calcium: 9.4 mg/dL (ref 8.4–10.5)
Chloride: 96 mEq/L (ref 96–112)
GFR: 93.71 mL/min (ref >60.00)
GLUCOSE: 104 mg/dL — AB (ref 70–99)
Potassium: 3.5 mEq/L (ref 3.5–5.1)
Sodium: 137 mEq/L (ref 135–145)
Total Bilirubin: 0.6 mg/dL (ref 0.2–1.2)
Total Protein: 7.8 g/dL (ref 6.0–8.3)

## 2015-09-30 LAB — LIPID PANEL
CHOL/HDL RATIO: 5
Cholesterol: 238 mg/dL — ABNORMAL HIGH (ref 0–200)
HDL: 43.7 mg/dL (ref >39.00)
LDL Cholesterol: 169 mg/dL — ABNORMAL HIGH (ref 0–99)
NONHDL: 194.4
Triglycerides: 126 mg/dL (ref 0.0–149.0)
VLDL: 25.2 mg/dL (ref 0.0–40.0)

## 2015-10-01 ENCOUNTER — Ambulatory Visit: Payer: BC Managed Care – PPO | Admitting: Medical

## 2015-10-01 NOTE — Telephone Encounter (Signed)
Opened to review pt recent labs and compare to prior.

## 2015-10-05 ENCOUNTER — Other Ambulatory Visit: Payer: Self-pay

## 2015-10-05 ENCOUNTER — Telehealth: Payer: Self-pay

## 2015-10-05 MED ORDER — POTASSIUM CHLORIDE CRYS ER 10 MEQ PO TBCR: 10.0000 meq | EXTENDED_RELEASE_TABLET | Freq: Every day | ORAL | 1 refills | Status: DC

## 2015-10-05 NOTE — Telephone Encounter (Signed)
Patient returned myu call regarding lab results. Patient states she does not take Crestor for cholesterol. States she was told to take  K-Dur qod not daily. Patient states she has a follow up appointment on Thursday. Do you want me to wait and call in meds then or call in as you ordered.

## 2015-10-06 ENCOUNTER — Telehealth: Payer: Self-pay | Admitting: Medical

## 2015-10-06 NOTE — Telephone Encounter (Signed)
You can go ahead and send the medicine in as stated in result note. I will discuss with her further on Thursday when she is in. Please let her know rx sent in.

## 2015-10-07 ENCOUNTER — Other Ambulatory Visit: Payer: Self-pay

## 2015-10-07 MED ORDER — ROSUVASTATIN CALCIUM 20 MG PO TABS
20.0000 mg | ORAL_TABLET | Freq: Every day | ORAL | 2 refills | Status: DC
Start: 2015-10-07 — End: 2015-12-16

## 2015-10-08 ENCOUNTER — Encounter: Payer: Self-pay | Admitting: Medical

## 2015-10-08 ENCOUNTER — Ambulatory Visit (INDEPENDENT_AMBULATORY_CARE_PROVIDER_SITE_OTHER): Payer: BC Managed Care – PPO | Admitting: Medical

## 2015-10-08 VITALS — BP 149/85 | HR 96 | Temp 98.0°F | Ht 67.0 in | Wt 220.0 lb

## 2015-10-08 DIAGNOSIS — I10 Essential (primary) hypertension: Secondary | ICD-10-CM | POA: Diagnosis not present

## 2015-10-08 DIAGNOSIS — E785 Hyperlipidemia, unspecified: Secondary | ICD-10-CM | POA: Diagnosis not present

## 2015-10-08 DIAGNOSIS — E876 Hypokalemia: Secondary | ICD-10-CM | POA: Diagnosis not present

## 2015-10-08 DIAGNOSIS — E118 Type 2 diabetes mellitus with unspecified complications: Secondary | ICD-10-CM

## 2015-10-08 MED ORDER — AMLODIPINE BESYLATE 5 MG PO TABS: 5.0000 mg | ORAL_TABLET | Freq: Every day | ORAL | 3 refills | Status: DC

## 2015-10-08 NOTE — Progress Notes (Signed)
Subjective:    Patient ID: Christina Leblanc, female    DOB: 1970-12-04, 45 y.o.   MRN: [id number]  HPI   Pt in for follow up.  Pt bp initially elevated. Last bp check was 124/84. One before that was 122/80.  Pt has not been checking her bp since I last saw. Pt this summer had no coffee/caffeinated beverage. Since school started drinking 2 cups of coffee a day. Mid day had Dr. Malachi Bonds. She thinks maybe coffee today brought up her bp to todays levels.  Pt before came in was rushing from work per her report.  Pt takes meds in the evening. Pt is on hctz 25 mg a day. In past lisinpril did not work.  Pt is going to start crestor. Recent lipid panel was still elevated. Pt is trying to exercise more. In past only dieted to attempt lipid control. Pt dad did have MI. Pt thinks lipitor in remote past caused fatigue. But does not remember any myalgias.  Pt is diabetic. She is on metformin. She can take 1 tab po q day. 1 tab po twice a day caused her to have diarrhea.   Pt has been been taking k only every other day. Pt was on k every other day. But was still at lower edge of normal.      Review of Systems  Constitutional: Negative for chills, fatigue and fever.  HENT: Negative for dental problem and ear discharge.   Respiratory: Negative for cough, chest tightness, shortness of breath and wheezing.   Cardiovascular: Negative for chest pain and palpitations.  Gastrointestinal: Negative for abdominal pain.  Musculoskeletal: Negative for back pain.  Skin: Negative for pallor and rash.  Neurological: Negative for dizziness and headaches.  Hematological: Negative for adenopathy. Does not bruise/bleed easily.  Psychiatric/Behavioral: Negative for behavioral problems and confusion.    Past Medical History:  Diagnosis Date  . Allergy   . Hyperlipidemia   . Hypertension      Social History   Social History  . Marital status: Divorced    Spouse name: N/A  . Number of children: N/A  .  Years of education: N/A   Occupational History  . Not on file.   Social History Main Topics  . Smoking status: Never Smoker  . Smokeless tobacco: Never Used  . Alcohol use 0.0 oz/week     Comment: wine occasionally .Twice.  . Drug use: No  . Sexual activity: Not on file   Other Topics Concern  . Not on file   Social History Narrative  . No narrative on file    Past Surgical History:  Procedure Laterality Date  . ABDOMINAL HYSTERECTOMY     2003    Family History  Problem Relation Age of Onset  . Diabetes Mother   . Diabetes Father   . Heart disease Father   . Cancer Brother   . Hypertension Brother     Allergies  Allergen Reactions  . Claritin [Loratadine]     Current Outpatient Prescriptions on File Prior to Visit  Medication Sig Dispense Refill  . acetaminophen (TYLENOL) 500 MG tablet Take 500 mg by mouth every 6 (six) hours as needed.    . hydrochlorothiazide (HYDRODIURIL) 25 MG tablet Take 1 tablet (25 mg total) by mouth daily. 90 tablet 1  . levocetirizine (XYZAL) 5 MG tablet Take 1 tablet (5 mg total) by mouth every evening. 30 tablet 2  . metFORMIN (GLUCOPHAGE) 500 MG tablet Take 1 tablet (500 mg total)  by mouth 2 (two) times daily with a meal. 60 tablet 2  . NON FORMULARY     . potassium chloride (K-DUR,KLOR-CON) 10 MEQ tablet Take 1 tablet (10 mEq total) by mouth daily. 30 tablet 1  . rosuvastatin (CRESTOR) 20 MG tablet Take 1 tablet (20 mg total) by mouth daily. 30 tablet 2   No current facility-administered medications on file prior to visit.     BP (!) 149/85   Pulse 96   Temp 98 F (36.7 C) (Oral)   Ht 5\' 7"  (1.702 m)   Wt 220 lb (99.8 kg)   SpO2 100%   BMI 34.46 kg/m       Objective:   Physical Exam  General Mental Status- Alert. General Appearance- Not in acute distress.   Skin General: Color- Normal Color. Moisture- Normal Moisture.  Neck Carotid Arteries- Normal color. Moisture- Normal Moisture. No carotid bruits. No  JVD.  Chest and Lung Exam Auscultation: Breath Sounds:-Normal.  Cardiovascular Auscultation:Rythm- Regular. Murmurs & Other Heart Sounds:Auscultation of the heart reveals- No Murmurs.  Abdomen Inspection:-Inspeection Normal. Palpation/Percussion:Note:No mass. Palpation and Percussion of the abdomen reveal- Non Tender, Non Distended + BS, no rebound or guarding.  Neurologic Cranial Nerve exam:- CN III-XII intact(No nystagmus), symmetric smile. Strength:- 5/5 equal and symmetric strength both upper and lower extremities.      Assessment & Plan:  For your htn continue hctz 25 mg a day. Try to reduce caffeine  Intake and low salt  diet. Check your bp at home. If your bp is not coming down less than 140/90. Then go ahead and add amlodipine 5 mg a day.  For low potasiium take your potassium tab daily.  For high cholesterol start crestor. Any side effects as discussed notify us and dsicontinue.  For diabetes continue metformin 1 tab a day. May increase to twice a day in future.  Oct 16 get cmp, a1c and lipid panel.  Follow up date to be determined after lab review.  Giulliana Mcroberts, Percell Miller, PA-C

## 2015-10-08 NOTE — Patient Instructions (Addendum)
For your htn continue hctz 25 mg a day. Try to reduce caffeine  Intake and low salt diet. Check your bp at home. If your bp is not coming down less than 140/90. Then go ahead and add amlodipine 5 mg a day.  For low potassium take your potassium tab daily.  For high cholesterol start crestor. Any side effects as discussed notify us and discontinue.   For diabetes continue metformin 1 tab a day. May increase to twice a day in future.  Oct 16 get cmp, a1c and lipid panel.  Follow up date to be determined after lab review.

## 2015-11-25 ENCOUNTER — Telehealth: Payer: Self-pay | Admitting: Medical

## 2015-11-25 ENCOUNTER — Other Ambulatory Visit (INDEPENDENT_AMBULATORY_CARE_PROVIDER_SITE_OTHER): Payer: BC Managed Care – PPO

## 2015-11-25 DIAGNOSIS — E118 Type 2 diabetes mellitus with unspecified complications: Secondary | ICD-10-CM

## 2015-11-25 DIAGNOSIS — E876 Hypokalemia: Secondary | ICD-10-CM

## 2015-11-25 DIAGNOSIS — E785 Hyperlipidemia, unspecified: Secondary | ICD-10-CM

## 2015-11-25 DIAGNOSIS — I10 Essential (primary) hypertension: Secondary | ICD-10-CM | POA: Diagnosis not present

## 2015-11-25 LAB — LIPID PANEL
CHOLESTEROL: 151 mg/dL (ref 0–200)
HDL: 45.5 mg/dL (ref >39.00)
LDL CALC: 92 mg/dL (ref 0–99)
NONHDL: 105.23
Total CHOL/HDL Ratio: 3
Triglycerides: 65 mg/dL (ref 0.0–149.0)
VLDL: 13 mg/dL (ref 0.0–40.0)

## 2015-11-25 LAB — COMPREHENSIVE METABOLIC PANEL
ALBUMIN: 4.3 g/dL (ref 3.5–5.2)
ALT: 14 U/L (ref 0–35)
AST: 20 U/L (ref 0–37)
Alkaline Phosphatase: 48 U/L (ref 39–117)
BUN: 11 mg/dL (ref 6–23)
CHLORIDE: 99 meq/L (ref 96–112)
CO2: 34 mEq/L — ABNORMAL HIGH (ref 19–32)
CREATININE: 0.82 mg/dL (ref 0.40–1.20)
Calcium: 9.7 mg/dL (ref 8.4–10.5)
GFR: 96.28 mL/min (ref >60.00)
GLUCOSE: 105 mg/dL — AB (ref 70–99)
POTASSIUM: 3.2 meq/L — AB (ref 3.5–5.1)
SODIUM: 140 meq/L (ref 135–145)
TOTAL PROTEIN: 7.6 g/dL (ref 6.0–8.3)
Total Bilirubin: 0.4 mg/dL (ref 0.2–1.2)

## 2015-11-25 LAB — HEMOGLOBIN A1C: HEMOGLOBIN A1C: 6.5 % (ref 4.6–6.5)

## 2015-11-26 MED ORDER — POTASSIUM CHLORIDE CRYS ER 20 MEQ PO TBCR: 20.0000 meq | EXTENDED_RELEASE_TABLET | Freq: Every day | ORAL | 2 refills | Status: DC

## 2015-11-26 NOTE — Telephone Encounter (Signed)
rx k-dur sent in. 

## 2015-12-03 ENCOUNTER — Ambulatory Visit: Payer: BC Managed Care – PPO | Admitting: Medical

## 2015-12-16 ENCOUNTER — Other Ambulatory Visit: Payer: Self-pay | Admitting: Medical

## 2015-12-21 ENCOUNTER — Telehealth: Payer: Self-pay | Admitting: Medical

## 2015-12-21 NOTE — Telephone Encounter (Signed)
Opened to review to determine if can refill he lipid medication. Approved since her lipids were well controlled.

## 2016-01-12 ENCOUNTER — Encounter: Payer: BC Managed Care – PPO | Admitting: Medical

## 2016-01-14 ENCOUNTER — Encounter: Payer: BC Managed Care – PPO | Admitting: Medical

## 2016-01-18 ENCOUNTER — Other Ambulatory Visit: Payer: Self-pay | Admitting: Medical

## 2016-01-21 ENCOUNTER — Telehealth: Payer: Self-pay | Admitting: Medical

## 2016-01-21 ENCOUNTER — Encounter: Payer: Self-pay | Admitting: Medical

## 2016-01-21 ENCOUNTER — Ambulatory Visit (INDEPENDENT_AMBULATORY_CARE_PROVIDER_SITE_OTHER): Payer: BC Managed Care – PPO | Admitting: Medical

## 2016-01-21 VITALS — BP 150/80 | HR 99 | Temp 98.2°F | Ht 67.0 in | Wt 215.4 lb

## 2016-01-21 DIAGNOSIS — Z0000 Encounter for general adult medical examination without abnormal findings: Secondary | ICD-10-CM

## 2016-01-21 DIAGNOSIS — Z1231 Encounter for screening mammogram for malignant neoplasm of breast: Secondary | ICD-10-CM | POA: Diagnosis not present

## 2016-01-21 DIAGNOSIS — Z1211 Encounter for screening for malignant neoplasm of colon: Secondary | ICD-10-CM | POA: Diagnosis not present

## 2016-01-21 DIAGNOSIS — Z1239 Encounter for other screening for malignant neoplasm of breast: Secondary | ICD-10-CM

## 2016-01-21 LAB — COMPREHENSIVE METABOLIC PANEL
ALBUMIN: 4.6 g/dL (ref 3.5–5.2)
ALT: 19 U/L (ref 0–35)
AST: 24 U/L (ref 0–37)
Alkaline Phosphatase: 54 U/L (ref 39–117)
BILIRUBIN TOTAL: 0.5 mg/dL (ref 0.2–1.2)
BUN: 12 mg/dL (ref 6–23)
CHLORIDE: 96 meq/L (ref 96–112)
CO2: 34 mEq/L — ABNORMAL HIGH (ref 19–32)
CREATININE: 0.81 mg/dL (ref 0.40–1.20)
Calcium: 9.7 mg/dL (ref 8.4–10.5)
GFR: 97.59 mL/min (ref >60.00)
Glucose, Bld: 100 mg/dL — ABNORMAL HIGH (ref 70–99)
Potassium: 3 mEq/L — ABNORMAL LOW (ref 3.5–5.1)
Sodium: 138 mEq/L (ref 135–145)
Total Protein: 8.2 g/dL (ref 6.0–8.3)

## 2016-01-21 LAB — POC URINALSYSI DIPSTICK (AUTOMATED)
Bilirubin, UA: NEGATIVE
Blood, UA: NEGATIVE
Glucose, UA: NEGATIVE
Ketones, UA: NEGATIVE
LEUKOCYTES UA: NEGATIVE
NITRITE UA: NEGATIVE
PROTEIN UA: NEGATIVE
Spec Grav, UA: 1.015
UROBILINOGEN UA: 0.2
pH, UA: 7.5

## 2016-01-21 LAB — CBC WITH DIFFERENTIAL/PLATELET
BASOS PCT: 0.4 % (ref 0.0–3.0)
Basophils Absolute: 0 10*3/uL (ref 0.0–0.1)
EOS ABS: 0 10*3/uL (ref 0.0–0.7)
EOS PCT: 0.7 % (ref 0.0–5.0)
HCT: 39.1 % (ref 36.0–46.0)
Hemoglobin: 12.8 g/dL (ref 12.0–15.0)
Lymphocytes Relative: 39.9 % (ref 12.0–46.0)
Lymphs Abs: 2.3 10*3/uL (ref 0.7–4.0)
MCHC: 32.8 g/dL (ref 30.0–36.0)
MCV: 83.7 fl (ref 78.0–100.0)
Monocytes Absolute: 0.5 10*3/uL (ref 0.1–1.0)
Monocytes Relative: 7.9 % (ref 3.0–12.0)
Neutro Abs: 3 10*3/uL (ref 1.4–7.7)
Neutrophils Relative %: 51.1 % (ref 43.0–77.0)
Platelets: 399 10*3/uL (ref 150.0–400.0)
RBC: 4.67 Mil/uL (ref 3.87–5.11)
RDW: 15.6 % — AB (ref 11.5–15.5)
WBC: 5.9 10*3/uL (ref 4.0–10.5)

## 2016-01-21 LAB — LIPID PANEL
CHOLESTEROL: 177 mg/dL (ref 0–200)
HDL: 53.5 mg/dL (ref >39.00)
LDL Cholesterol: 108 mg/dL — ABNORMAL HIGH (ref 0–99)
NonHDL: 123.22
Total CHOL/HDL Ratio: 3
Triglycerides: 77 mg/dL (ref 0.0–149.0)
VLDL: 15.4 mg/dL (ref 0.0–40.0)

## 2016-01-21 LAB — TSH: TSH: 1.08 u[IU]/mL (ref 0.35–4.50)

## 2016-01-21 NOTE — Patient Instructions (Addendum)
For you wellness exam today I have ordered cbc, cmp, tsh, lipid panel, and ua.   Pt declined flu vaccine today.  Pt up to date on her pap.  Pt needs mammogram done this year.   Recommend exercise and healthy diet.  We will let you know lab results as they come in.  Follow up date appointment will be determined after lab review.   Your bp was elevated initially today but consistent low readings/good reading at home. So I do think you have component of white coat htn.   Preventive Care 40-64 Years, Female Preventive care refers to lifestyle choices and visits with your health care provider that can promote health and wellness. What does preventive care include?  A yearly physical exam. This is also called an annual well check.  Dental exams once or twice a year.  Routine eye exams. Ask your health care provider how often you should have your eyes checked.  Personal lifestyle choices, including:  Daily care of your teeth and gums.  Regular physical activity.  Eating a healthy diet.  Avoiding tobacco and drug use.  Limiting alcohol use.  Practicing safe sex.  Taking low-dose aspirin daily starting at age 71.  Taking vitamin and mineral supplements as recommended by your health care provider. What happens during an annual well check? The services and screenings done by your health care provider during your annual well check will depend on your age, overall health, lifestyle risk factors, and family history of disease. Counseling  Your health care provider may ask you questions about your:  Alcohol use.  Tobacco use.  Drug use.  Emotional well-being.  Home and relationship well-being.  Sexual activity.  Eating habits.  Work and work Statistician.  Method of birth control.  Menstrual cycle.  Pregnancy history. Screening  You may have the following tests or measurements:  Height, weight, and BMI.  Blood pressure.  Lipid and cholesterol levels. These  may be checked every 5 years, or more frequently if you are over 21 years old.  Skin check.  Lung cancer screening. You may have this screening every year starting at age 39 if you have a 30-pack-year history of smoking and currently smoke or have quit within the past 15 years.  Fecal occult blood test (FOBT) of the stool. You may have this test every year starting at age 41.  Flexible sigmoidoscopy or colonoscopy. You may have a sigmoidoscopy every 5 years or a colonoscopy every 10 years starting at age 37.  Hepatitis C blood test.  Hepatitis B blood test.  Sexually transmitted disease (STD) testing.  Diabetes screening. This is done by checking your blood sugar (glucose) after you have not eaten for a while (fasting). You may have this done every 1-3 years.  Mammogram. This may be done every 1-2 years. Talk to your health care provider about when you should start having regular mammograms. This may depend on whether you have a family history of breast cancer.  BRCA-related cancer screening. This may be done if you have a family history of breast, ovarian, tubal, or peritoneal cancers.  Pelvic exam and Pap test. This may be done every 3 years starting at age 81. Starting at age 45, this may be done every 5 years if you have a Pap test in combination with an HPV test.  Bone density scan. This is done to screen for osteoporosis. You may have this scan if you are at high risk for osteoporosis. Discuss your test results, treatment  options, and if necessary, the need for more tests with your health care provider. Vaccines  Your health care provider may recommend certain vaccines, such as:  Influenza vaccine. This is recommended every year.  Tetanus, diphtheria, and acellular pertussis (Tdap, Td) vaccine. You may need a Td booster every 10 years.  Varicella vaccine. You may need this if you have not been vaccinated.  Zoster vaccine. You may need this after age 105.  Measles, mumps, and  rubella (MMR) vaccine. You may need at least one dose of MMR if you were born in 1957 or later. You may also need a second dose.  Pneumococcal 13-valent conjugate (PCV13) vaccine. You may need this if you have certain conditions and were not previously vaccinated.  Pneumococcal polysaccharide (PPSV23) vaccine. You may need one or two doses if you smoke cigarettes or if you have certain conditions.  Meningococcal vaccine. You may need this if you have certain conditions.  Hepatitis A vaccine. You may need this if you have certain conditions or if you travel or work in places where you may be exposed to hepatitis A.  Hepatitis B vaccine. You may need this if you have certain conditions or if you travel or work in places where you may be exposed to hepatitis B.  Haemophilus influenzae type b (Hib) vaccine. You may need this if you have certain conditions. Talk to your health care provider about which screenings and vaccines you need and how often you need them. This information is not intended to replace advice given to you by your health care provider. Make sure you discuss any questions you have with your health care provider. Document Released: 01/30/2015 Document Revised: 09/23/2015 Document Reviewed: 11/04/2014 Elsevier Interactive Patient Education  2017 [employer].

## 2016-01-21 NOTE — Progress Notes (Deleted)
Pre visit review using our clinic review tool, if applicable. No additional management support is needed unless otherwise documented below in the visit note. 

## 2016-01-21 NOTE — Telephone Encounter (Signed)
Relation to [id number] Call back number:[phone]   Reason for call:  Patient returning call regarding urine results, please advise

## 2016-01-21 NOTE — Telephone Encounter (Signed)
Returned call.  No answer.  Left detailed message on patient's phone informing her of results.  She was encouraged to call back with questions or concerns.

## 2016-01-21 NOTE — Progress Notes (Signed)
Subjective:    Patient ID: Christina Leblanc, female    DOB: 08/27/1970, 46 y.o.   MRN: [id number]  HPI  I have reviewed pt PMH, PSH, FH, Social History and Surgical History.  Pt states at home her bp readings have been 130/80 range at home. She has been checking her bp twice a week for the past month. But today on initial reading was high.  Pt had normal pap smears in the past. No vaginal complaints. Last pap 2016.  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 Walks twice a week, 1 cup of coffee a day, Admits no healthy diet, divorced- no children.  No family history of colon cancer.  Pt has been exercising with her sisters. She has personal trainer mon, wed and Friday.  Pt does self breast exams and no concerns.  Pt is on one metformin without any side effects.                                                                                                                                                                                                                      Review of Systems  Constitutional: Negative for chills, fatigue and fever.  HENT: Negative for congestion, ear discharge, postnasal drip, sinus pain and sinus pressure.   Respiratory: Negative for cough, shortness of breath and wheezing.   Cardiovascular: Negative for chest pain and palpitations.  Gastrointestinal: Negative for abdominal pain, blood in stool, constipation, diarrhea, nausea and vomiting.  Genitourinary: Negative for difficulty urinating, dysuria, flank pain, frequency, hematuria, pelvic pain and urgency.  Musculoskeletal: Negative for back pain.  Skin: Negative for rash.  Neurological: Negative for dizziness, speech difficulty, weakness and headaches.  Hematological: Negative for adenopathy. Does not  bruise/bleed easily.  Psychiatric/Behavioral: Negative for behavioral problems and confusion.    Past Medical History:  Diagnosis Date  . Allergy   . Hyperlipidemia   . Hypertension      Social History   Social History  . Marital status: Divorced    Spouse name: N/A  . Number of children: N/A  . Years of education: N/A   Occupational History  . Not on file.   Social History Main Topics  . Smoking status: Never Smoker  . Smokeless tobacco: Never Used  . Alcohol use 0.0 oz/week     Comment: wine occasionally .Twice.  . Drug use: No  . Sexual activity: Not on file   Other Topics Concern  . Not on file   Social History  Narrative  . No narrative on file    Past Surgical History:  Procedure Laterality Date  . ABDOMINAL HYSTERECTOMY     2003    Family History  Problem Relation Age of Onset  . Diabetes Father   . Heart disease Father   . Diabetes Mother   . Cancer Brother   . Hypertension Brother     Allergies  Allergen Reactions  . Claritin [Loratadine]     Current Outpatient Prescriptions on File Prior to Visit  Medication Sig Dispense Refill  . acetaminophen (TYLENOL) 500 MG tablet Take 500 mg by mouth every 6 (six) hours as needed.    Marland Kitchen amLODipine (NORVASC) 5 MG tablet Take 1 tablet (5 mg total) by mouth daily. 30 tablet 3  . hydrochlorothiazide (HYDRODIURIL) 25 MG tablet Take 1 tablet (25 mg total) by mouth daily. 90 tablet 1  . levocetirizine (XYZAL) 5 MG tablet Take 1 tablet (5 mg total) by mouth every evening. 30 tablet 2  . metFORMIN (GLUCOPHAGE) 500 MG tablet TAKE ONE TABLET BY MOUTH TWICE DAILY WITH MEALS 60 tablet 2  . potassium chloride SA (K-DUR,KLOR-CON) 20 MEQ tablet Take 1 tablet (20 mEq total) by mouth daily. Stop former 10 meq rx. 30 tablet 2  . rosuvastatin (CRESTOR) 20 MG tablet TAKE ONE TABLET BY MOUTH ONCE DAILY 30 tablet 2  . NON FORMULARY      No current facility-administered medications on file prior to visit.     BP (!) 167/94  (BP Location: Left Arm, Patient Position: Sitting, Cuff Size: Normal)   Pulse 99   Temp 98.2 F (36.8 C) (Oral)   Ht 5\' 7"  (1.702 m)   Wt 215 lb 6.4 oz (97.7 kg)   SpO2 97%   BMI 33.74 kg/m       Objective:   Physical Exam  General Mental Status- Alert. General Appearance- Not in acute distress.   Skin General:very small tiny moles on her back. None worrisome. Very small tiny skin tags on anterior neck.  Neck Carotid Arteries- Normal color. Moisture- Normal Moisture. No carotid bruits. No JVD.  Chest and Lung Exam Auscultation: Breath Sounds:-Normal.  Cardiovascular Auscultation:Rythm- Regular. Murmurs & Other Heart Sounds:Auscultation of the heart reveals- No Murmurs.  Abdomen Inspection:-Inspeection Normal. Palpation/Percussion:Note:No mass. Palpation and Percussion of the abdomen reveal- Non Tender, Non Distended + BS, no rebound or guarding.  Neurologic Cranial Nerve exam:- CN III-XII intact(No nystagmus), symmetric smile. Strength:- 5/5 equal and symmetric strength both upper and lower extremities.  Breast and pelvic- pt declined today since normal pap and she does her self breast exam      Assessment & Plan:  For you wellness exam today I have ordered cbc, cmp, tsh, lipid panel, and ua.    Pt declined flu vaccine today.  Pt up to date on her pap.  Pt needs mammogram done this year.   Recommend exercise and healthy diet.  We will let you know lab results as they come in.  Follow up date appointment will be determined after lab review.   Your bp was elevated initially today but consistent low readings/good reading at home. So I do think you have component of white coat htn.  Anajah Sterbenz, Percell Miller, PA-C

## 2016-01-22 NOTE — Telephone Encounter (Signed)
Opened for review. 

## 2016-01-26 ENCOUNTER — Other Ambulatory Visit: Payer: Self-pay

## 2016-01-26 ENCOUNTER — Ambulatory Visit (HOSPITAL_BASED_OUTPATIENT_CLINIC_OR_DEPARTMENT_OTHER)
Admission: RE | Admit: 2016-01-26 | Discharge: 2016-01-26 | Disposition: A | Discharge status: Home / Self Care | Payer: BC Managed Care – PPO | Source: Ambulatory Visit | Attending: Medical | Admitting: Medical

## 2016-01-26 ENCOUNTER — Telehealth: Payer: Self-pay

## 2016-01-26 DIAGNOSIS — Z1239 Encounter for other screening for malignant neoplasm of breast: Secondary | ICD-10-CM

## 2016-01-26 DIAGNOSIS — E876 Hypokalemia: Secondary | ICD-10-CM

## 2016-01-26 DIAGNOSIS — Z1231 Encounter for screening mammogram for malignant neoplasm of breast: Secondary | ICD-10-CM | POA: Diagnosis present

## 2016-01-26 MED ORDER — POTASSIUM CHLORIDE CRYS ER 20 MEQ PO TBCR: EXTENDED_RELEASE_TABLET | ORAL | 1 refills | Status: DC

## 2016-01-26 NOTE — Telephone Encounter (Signed)
Called patient and made her aware of plan.  Pt stated understanding and agreed with it as stated by Dr. Charlett Blake.  Repeat lab appt scheduled for 1/12 at 4 pm.  Future lab ordered.

## 2016-01-26 NOTE — Progress Notes (Signed)
Future lab ordered for lab appt scheduled on 01/29/16.

## 2016-01-26 NOTE — Telephone Encounter (Signed)
Notes Recorded by Mosie Lukes, MD on 01/26/2016 at 5:09 PM EST Increase KCL 20 Meq tabs, 3 tabs po qd x 3 days then 2 tabs po qd, disp #35 with 1 rf and recheck CMP on 1/12. Seek care if palpitations, muscle cramps or CP occurs ------  Notes Recorded by Rudene Anda, RN on 01/26/2016 at 3:54 PM EST Pt returned call. Discussed lab results. Pt stated understanding and stated that she is taking potassium faithfully as prescribed. She agrees with higher dose and asked that Rx be called in this afternoon as she's going to the pharmacy after she has her mammogram today.   Dr. Charlett Blake, will you advise new dose with Percell Miller being out of the office this afternoon? ------  Notes Recorded by Rudene Anda, RN on 01/26/2016 at 10:47 AM EST Called and left a message for call back. ------  Notes Recorded by Mackie Pai, PA-C on 01/21/2016 at 9:50 PM EST Pt has low potasium. Minimal high sugar at 100. Mild ldl elevation but otherwise lipids look good. Thyroid normal. Wbc not elevated. Diet and exercise will help both cholesterol and sugar level. Continue same med. New exercise program should be helpful.   Regarding potassium the last potassium tab was 20 meq. Has she been taking the tab daily. Might have to rx higher dose. But wanted to clarify compliance first?

## 2016-01-28 ENCOUNTER — Telehealth: Payer: Self-pay | Admitting: Medical

## 2016-01-28 NOTE — Telephone Encounter (Signed)
Reviewed that Dr. Charlett Blake handled question on k dosing. I had sent message to staff on Thursday night. Looks like it got addressed on following Tuesday when I was not in the office.

## 2016-01-29 ENCOUNTER — Other Ambulatory Visit (INDEPENDENT_AMBULATORY_CARE_PROVIDER_SITE_OTHER): Payer: BC Managed Care – PPO

## 2016-01-29 DIAGNOSIS — E876 Hypokalemia: Secondary | ICD-10-CM | POA: Diagnosis not present

## 2016-01-29 LAB — COMPREHENSIVE METABOLIC PANEL
ALT: 17 U/L (ref 0–35)
AST: 23 U/L (ref 0–37)
Albumin: 4.3 g/dL (ref 3.5–5.2)
Alkaline Phosphatase: 46 U/L (ref 39–117)
BUN: 11 mg/dL (ref 6–23)
CO2: 34 mEq/L — ABNORMAL HIGH (ref 19–32)
Calcium: 9.5 mg/dL (ref 8.4–10.5)
Chloride: 99 mEq/L (ref 96–112)
Creatinine, Ser: 0.8 mg/dL (ref 0.40–1.20)
GFR: 98.99 mL/min (ref >60.00)
Glucose, Bld: 92 mg/dL (ref 70–99)
POTASSIUM: 3.2 meq/L — AB (ref 3.5–5.1)
SODIUM: 139 meq/L (ref 135–145)
TOTAL PROTEIN: 7.4 g/dL (ref 6.0–8.3)
Total Bilirubin: 0.3 mg/dL (ref 0.2–1.2)

## 2016-02-02 ENCOUNTER — Telehealth: Payer: Self-pay

## 2016-02-02 NOTE — Telephone Encounter (Signed)
Spoke with patient to review lab results with her. Patient voiced her understanding. Patient stated she will continue taking 1 tablet of the KCL daily because she feels better. Patient stated that she does not think she was on the medication long enough when her lab work was done. Patient stated she will continue on the KCL for 1 a day then will call back when she thinks she needs lab done again.  PC

## 2016-02-15 ENCOUNTER — Other Ambulatory Visit: Payer: Self-pay | Admitting: Medical

## 2016-02-16 ENCOUNTER — Other Ambulatory Visit: Payer: Self-pay | Admitting: *Deleted

## 2016-02-16 MED ORDER — HYDROCHLOROTHIAZIDE 25 MG PO TABS: 25.0000 mg | ORAL_TABLET | Freq: Every day | ORAL | 1 refills | Status: DC

## 2016-02-16 NOTE — Telephone Encounter (Signed)
Rx request to pharmacy/SLS  

## 2016-02-23 ENCOUNTER — Other Ambulatory Visit: Payer: Self-pay | Admitting: Medical

## 2016-03-03 ENCOUNTER — Ambulatory Visit (INDEPENDENT_AMBULATORY_CARE_PROVIDER_SITE_OTHER): Payer: BC Managed Care – PPO | Admitting: Medical

## 2016-03-03 ENCOUNTER — Ambulatory Visit: Payer: BC Managed Care – PPO | Admitting: Medical

## 2016-03-03 ENCOUNTER — Ambulatory Visit (HOSPITAL_BASED_OUTPATIENT_CLINIC_OR_DEPARTMENT_OTHER)
Admission: RE | Admit: 2016-03-03 | Discharge: 2016-03-03 | Disposition: A | Discharge status: Home / Self Care | Payer: BC Managed Care – PPO | Source: Ambulatory Visit | Attending: Medical | Admitting: Medical

## 2016-03-03 ENCOUNTER — Encounter: Payer: Self-pay | Admitting: Medical

## 2016-03-03 VITALS — BP 148/85 | HR 96 | Temp 97.8°F | Ht 67.0 in | Wt 212.6 lb

## 2016-03-03 DIAGNOSIS — M25812 Other specified joint disorders, left shoulder: Secondary | ICD-10-CM | POA: Insufficient documentation

## 2016-03-03 DIAGNOSIS — M25512 Pain in left shoulder: Secondary | ICD-10-CM | POA: Diagnosis not present

## 2016-03-03 DIAGNOSIS — M62838 Other muscle spasm: Secondary | ICD-10-CM | POA: Diagnosis not present

## 2016-03-03 MED ORDER — AMLODIPINE BESYLATE 5 MG PO TABS: 5.0000 mg | ORAL_TABLET | Freq: Every day | ORAL | 1 refills | Status: DC

## 2016-03-03 MED ORDER — CYCLOBENZAPRINE HCL 5 MG PO TABS: 5.0000 mg | ORAL_TABLET | Freq: Every day | ORAL | 0 refills | Status: AC

## 2016-03-03 MED ORDER — MELOXICAM 7.5 MG PO TABS: 7.5000 mg | ORAL_TABLET | Freq: Every day | ORAL | 0 refills | Status: DC

## 2016-03-03 NOTE — Progress Notes (Signed)
Subjective:    Patient ID: Christina Leblanc, female    DOB: [birth year], 46 y.o.   MRN: [id number]  HPI  Pt in with some left shoulder pain. Pt states just started going to gym since January. Pt states pain has been intermtiment since over past 2-3 weeks. Pain about 3 days a week. Often  she does  Shoulder pain while doing exercises. Will have catch sensation. When she presses on area will feel the pain.  No meds used for pain. Icing the area. No chest pain.  Review of Systems  Respiratory: Negative for cough, chest tightness, shortness of breath and wheezing.   Cardiovascular: Negative for chest pain and palpitations.  Musculoskeletal: Negative for back pain.       Lt shoulder pain and trapezius pain.  Skin: Negative for rash.  Hematological: Negative for adenopathy. Does not bruise/bleed easily.  Psychiatric/Behavioral: Negative for confusion and decreased concentration.    Past Medical History:  Diagnosis Date  . Allergy   . Hyperlipidemia   . Hypertension      Social History   Social History  . Marital status: Divorced    Spouse name: N/A  . Number of children: N/A  . Years of education: N/A   Occupational History  . Not on file.   Social History Main Topics  . Smoking status: Never Smoker  . Smokeless tobacco: Never Used  . Alcohol use 0.0 oz/week     Comment: wine occasionally .Twice.  . Drug use: No  . Sexual activity: Not on file   Other Topics Concern  . Not on file   Social History Narrative  . No narrative on file    Past Surgical History:  Procedure Laterality Date  . ABDOMINAL HYSTERECTOMY     2003    Family History  Problem Relation Age of Onset  . Diabetes Father   . Heart disease Father   . Diabetes Mother   . Cancer Brother   . Hypertension Brother     Allergies  Allergen Reactions  . Claritin [Loratadine] Palpitations    Current Outpatient Prescriptions on File Prior to Visit  Medication Sig Dispense Refill  .  acetaminophen (TYLENOL) 500 MG tablet Take 500 mg by mouth every 6 (six) hours as needed.    Marland Kitchen amLODipine (NORVASC) 5 MG tablet TAKE ONE TABLET BY MOUTH ONCE DAILY 30 tablet 3  . hydrochlorothiazide (HYDRODIURIL) 25 MG tablet Take 1 tablet (25 mg total) by mouth daily. 90 tablet 1  . levocetirizine (XYZAL) 5 MG tablet Take 1 tablet (5 mg total) by mouth every evening. 30 tablet 2  . metFORMIN (GLUCOPHAGE) 500 MG tablet TAKE ONE TABLET BY MOUTH TWICE DAILY WITH MEALS 60 tablet 2  . NON FORMULARY     . potassium chloride SA (K-DUR,KLOR-CON) 20 MEQ tablet Take 3 tablets by mouth every day for three days, then take 2 tablets by mouth every day. 35 tablet 1  . rosuvastatin (CRESTOR) 20 MG tablet TAKE ONE TABLET BY MOUTH ONCE DAILY 30 tablet 2   No current facility-administered medications on file prior to visit.     BP (!) 158/80   Pulse 96   Temp 97.8 F (36.6 C) (Oral)   Ht 5\' 7"  (1.702 m)   Wt 212 lb 9.6 oz (96.4 kg)   SpO2 100%   BMI 33.30 kg/m       Objective:   Physical Exam  General- No acute distress. Pleasant patient. Neck- Full range of motion, no jvd  Lungs- Clear, even and unlabored. Heart- regular rate and rhythm. Neurologic- CNII- XII grossly intact.   Lt shoulder- mild lateral tenderness to palpation. Faint pain medial bicep. Mild pain on abduction. Lt trapezius.- tender upper aspect anterior and posterio      Assessment & Plan:  For your shoulder pain and trapezius pain will rx meloxicam for pain and inflammation. Flexeril to use at night.  Mild range of motion exercise. When you go to gym decrease weight and avoid any shoulder exercises.   Refer to sports medicine.  Left shoulder xray  Follow up in 3 weeks or as needed  Pt not on amlodipine. Recently ran out. Her bp mild elevated. Thus bp elevated  Netasha Wehrli, Percell Miller, [place]

## 2016-03-03 NOTE — Progress Notes (Signed)
Pre visit review using our clinic review tool, if applicable. No additional management support is needed unless otherwise documented below in the visit note. 

## 2016-03-03 NOTE — Patient Instructions (Addendum)
For your shoulder pain and trapezius pain will rx meloxicam for pain and inflammation. Flexeril to use at night.  Mild range of motion exercise. When you go to gym decrease weight and avoid any shoulder exercises.   Refer to sports medicine.  Left shoulder xray  Follow up in 3 weeks or as needed

## 2016-03-17 ENCOUNTER — Ambulatory Visit: Payer: BC Managed Care – PPO | Admitting: Family Medicine

## 2016-04-01 ENCOUNTER — Other Ambulatory Visit: Payer: Self-pay | Admitting: Medical

## 2016-05-20 ENCOUNTER — Other Ambulatory Visit: Payer: Self-pay | Admitting: Medical

## 2016-06-23 ENCOUNTER — Other Ambulatory Visit: Payer: Self-pay | Admitting: Medical

## 2016-06-23 ENCOUNTER — Other Ambulatory Visit: Payer: Self-pay | Admitting: Family Medicine

## 2016-06-28 ENCOUNTER — Telehealth: Payer: Self-pay | Admitting: Medical

## 2016-06-28 NOTE — Telephone Encounter (Signed)
Pt req refill metformin while waiting for edward saguier to get back. Pt uses [facility] in [place].

## 2016-06-28 NOTE — Telephone Encounter (Signed)
Pt is due for follow up please call and schedule.  

## 2016-06-28 NOTE — Telephone Encounter (Signed)
Left message on voicemail for patient to call the office to schedule F/U.

## 2016-06-28 NOTE — Telephone Encounter (Signed)
Notified pt that she has 2 refills on metformin at pharmacy.

## 2016-07-14 ENCOUNTER — Ambulatory Visit: Payer: BC Managed Care – PPO | Admitting: Medical

## 2016-07-25 ENCOUNTER — Encounter: Payer: Self-pay | Admitting: Medical

## 2016-07-25 ENCOUNTER — Ambulatory Visit (INDEPENDENT_AMBULATORY_CARE_PROVIDER_SITE_OTHER): Payer: BC Managed Care – PPO | Admitting: Medical

## 2016-07-25 VITALS — BP 139/89 | HR 93 | Temp 98.1°F | Resp 16 | Ht 67.0 in | Wt 215.6 lb

## 2016-07-25 DIAGNOSIS — E876 Hypokalemia: Secondary | ICD-10-CM

## 2016-07-25 DIAGNOSIS — E119 Type 2 diabetes mellitus without complications: Secondary | ICD-10-CM

## 2016-07-25 DIAGNOSIS — E785 Hyperlipidemia, unspecified: Secondary | ICD-10-CM | POA: Diagnosis not present

## 2016-07-25 DIAGNOSIS — I10 Essential (primary) hypertension: Secondary | ICD-10-CM

## 2016-07-25 MED ORDER — POTASSIUM CHLORIDE CRYS ER 20 MEQ PO TBCR
EXTENDED_RELEASE_TABLET | ORAL | 0 refills | Status: DC
Start: 2016-07-25 — End: 2016-07-30

## 2016-07-25 MED ORDER — AMLODIPINE BESYLATE 5 MG PO TABS: 5.0000 mg | ORAL_TABLET | Freq: Every day | ORAL | 1 refills | Status: DC

## 2016-07-25 MED ORDER — HYDROCHLOROTHIAZIDE 25 MG PO TABS: 25.0000 mg | ORAL_TABLET | Freq: Every day | ORAL | 1 refills | Status: DC

## 2016-07-25 MED ORDER — METFORMIN HCL 500 MG PO TABS: 500.0000 mg | ORAL_TABLET | Freq: Two times a day (BID) | ORAL | 0 refills | Status: DC

## 2016-07-25 NOTE — Patient Instructions (Addendum)
Your bp is good today. At home readings better. I think you may have some  component of white coat htn. Want you to increase your check to 2-3 times week to verify bp are in good range. Less than 140/90.  Will refill your bp meds today.  For diabetes will get a1c on Friday along with metabolic panel.  For cholesterol will put in future lipid panel.  Follow up in 7 days or as needed

## 2016-07-25 NOTE — Progress Notes (Signed)
Subjective:    Patient ID: Christina Leblanc, female    DOB: 14-Dec-1970, 46 y.o.   MRN: [id number]  HPI  Pt states she checks her bp at home. Usually her bp in am 034/74 up to diastolic high in am when she usually check are at most 87-88. Sometimes in in afternoon she will see blood pressure rare in afternoon 140/90. Most of time she checks in morning.  Pt is checks her blood pressure one time a week. Pt states she gets nervous when she comes in. When she get in [place] she states she gets real  Nervous today.  Pt a1c was 6.5 in the past. She is following low sugar diet but admits not real strict Pt only taking one tablet of metformin a day. I had wanted her to advance to twice a day.  Pt cholesterol elevated in the past. Moderate compliance low cholesterol diet. Less fried foods.    Review of Systems  Constitutional: Negative for chills and fatigue.  HENT: Negative for congestion and drooling.   Respiratory: Negative for cough, chest tightness and wheezing.   Cardiovascular: Negative for chest pain and palpitations.  Gastrointestinal: Negative for abdominal pain.  Musculoskeletal: Negative for back pain.  Skin: Negative for rash.  Neurological: Negative for dizziness, speech difficulty, weakness and headaches.  Hematological: Negative for adenopathy. Does not bruise/bleed easily.  Psychiatric/Behavioral: Negative for behavioral problems and confusion.   Past Medical History:  Diagnosis Date  . Allergy   . Hyperlipidemia   . Hypertension      Social History   Social History  . Marital status: Divorced    Spouse name: N/A  . Number of children: N/A  . Years of education: N/A   Occupational History  . Not on file.   Social History Main Topics  . Smoking status: Never Smoker  . Smokeless tobacco: Never Used  . Alcohol use 0.0 oz/week     Comment: wine occasionally .Twice.  . Drug use: No  . Sexual activity: Not on file   Other Topics Concern  . Not on file    Social History Narrative  . No narrative on file    Past Surgical History:  Procedure Laterality Date  . ABDOMINAL HYSTERECTOMY     2003    Family History  Problem Relation Age of Onset  . Diabetes Father   . Heart disease Father   . Diabetes Mother   . Cancer Brother   . Hypertension Brother     Allergies  Allergen Reactions  . Claritin [Loratadine] Palpitations    Current Outpatient Prescriptions on File Prior to Visit  Medication Sig Dispense Refill  . acetaminophen (TYLENOL) 500 MG tablet Take 500 mg by mouth every 6 (six) hours as needed.    . cyclobenzaprine (FLEXERIL) 5 MG tablet Take 1 tablet (5 mg total) by mouth at bedtime. 10 tablet 0  . levocetirizine (XYZAL) 5 MG tablet Take 1 tablet (5 mg total) by mouth every evening. 30 tablet 2  . meloxicam (MOBIC) 7.5 MG tablet Take 1 tablet (7.5 mg total) by mouth daily. 30 tablet 0  . NON FORMULARY     . rosuvastatin (CRESTOR) 20 MG tablet TAKE ONE TABLET BY MOUTH ONCE DAILY 30 tablet 2   No current facility-administered medications on file prior to visit.     BP 139/89   Pulse 93   Temp 98.1 F (36.7 C) (Oral)   Resp 16   Ht 5\' 7"  (1.702 m)  Wt 215 lb 9.6 oz (97.8 kg)   SpO2 100%   BMI 33.77 kg/m       Objective:   Physical Exam  General Mental Status- Alert. General Appearance- Not in acute distress.   Skin General: Color- Normal Color. Moisture- Normal Moisture.  Neck Carotid Arteries- Normal color. Moisture- Normal Moisture. No carotid bruits. No JVD.  Chest and Lung Exam Auscultation: Breath Sounds:-Normal.  Cardiovascular Auscultation:Rythm- Regular. Murmurs & Other Heart Sounds:Auscultation of the heart reveals- No Murmurs.  Abdomen Inspection:-Inspeection Normal. Palpation/Percussion:Note:No mass. Palpation and Percussion of the abdomen reveal- Non Tender, Non Distended + BS, no rebound or guarding.    Neurologic Cranial Nerve exam:- CN III-XII intact(No nystagmus), symmetric  smile. Drift Test:- No drift. Romberg Exam:- Negative.  Heal to Toe Gait exam:-Normal. Finger to Nose:- Normal/Intact Strength:- 5/5 equal and symmetric strength both upper and lower extremities.      Assessment & Plan:  Your bp is good today. At home readings better. I think you may have some component of white coat htn. Want you to increase your check to 2-3 times week to verify bp are in good range. Less than 140/90.  Will refill your bp meds today.  For diabetes will get a1c on Friday along with metabolic panel.  For cholesterol will put in future lipid panel.  Follow up in 7 days or as needed

## 2016-07-29 ENCOUNTER — Other Ambulatory Visit (INDEPENDENT_AMBULATORY_CARE_PROVIDER_SITE_OTHER): Payer: BC Managed Care – PPO

## 2016-07-29 DIAGNOSIS — E785 Hyperlipidemia, unspecified: Secondary | ICD-10-CM | POA: Diagnosis not present

## 2016-07-29 DIAGNOSIS — E876 Hypokalemia: Secondary | ICD-10-CM | POA: Diagnosis not present

## 2016-07-29 DIAGNOSIS — I10 Essential (primary) hypertension: Secondary | ICD-10-CM

## 2016-07-29 DIAGNOSIS — E119 Type 2 diabetes mellitus without complications: Secondary | ICD-10-CM

## 2016-07-29 LAB — LIPID PANEL
CHOL/HDL RATIO: 5
Cholesterol: 233 mg/dL — ABNORMAL HIGH (ref 0–200)
HDL: 48.5 mg/dL (ref >39.00)
LDL Cholesterol: 169 mg/dL — ABNORMAL HIGH (ref 0–99)
NONHDL: 184.27
Triglycerides: 76 mg/dL (ref 0.0–149.0)
VLDL: 15.2 mg/dL (ref 0.0–40.0)

## 2016-07-29 LAB — COMPREHENSIVE METABOLIC PANEL
ALBUMIN: 4.3 g/dL (ref 3.5–5.2)
ALK PHOS: 48 U/L (ref 39–117)
ALT: 11 U/L (ref 0–35)
AST: 17 U/L (ref 0–37)
BILIRUBIN TOTAL: 0.4 mg/dL (ref 0.2–1.2)
BUN: 14 mg/dL (ref 6–23)
CO2: 29 mEq/L (ref 19–32)
Calcium: 9.8 mg/dL (ref 8.4–10.5)
Chloride: 97 mEq/L (ref 96–112)
Creatinine, Ser: 0.88 mg/dL (ref 0.40–1.20)
GFR: 88.49 mL/min (ref >60.00)
Glucose, Bld: 99 mg/dL (ref 70–99)
Potassium: 3.3 mEq/L — ABNORMAL LOW (ref 3.5–5.1)
Sodium: 136 mEq/L (ref 135–145)
TOTAL PROTEIN: 7.8 g/dL (ref 6.0–8.3)

## 2016-07-29 LAB — HEMOGLOBIN A1C: HEMOGLOBIN A1C: 6.6 % — AB (ref 4.6–6.5)

## 2016-07-30 ENCOUNTER — Telehealth: Payer: Self-pay | Admitting: Medical

## 2016-07-30 MED ORDER — POTASSIUM CHLORIDE CRYS ER 20 MEQ PO TBCR: 20.0000 meq | EXTENDED_RELEASE_TABLET | Freq: Every day | ORAL | 3 refills | Status: DC

## 2016-07-30 MED ORDER — ROSUVASTATIN CALCIUM 40 MG PO TABS: 40.0000 mg | ORAL_TABLET | Freq: Every day | ORAL | 3 refills | Status: DC

## 2016-07-30 NOTE — Telephone Encounter (Signed)
rx potassium and crestor sent to the pharmacy.

## 2016-11-03 ENCOUNTER — Other Ambulatory Visit (INDEPENDENT_AMBULATORY_CARE_PROVIDER_SITE_OTHER): Payer: BC Managed Care – PPO

## 2016-11-03 ENCOUNTER — Other Ambulatory Visit: Payer: Self-pay | Admitting: Emergency Medicine

## 2016-11-03 DIAGNOSIS — E785 Hyperlipidemia, unspecified: Secondary | ICD-10-CM

## 2016-11-03 DIAGNOSIS — E876 Hypokalemia: Secondary | ICD-10-CM | POA: Diagnosis not present

## 2016-11-03 DIAGNOSIS — R739 Hyperglycemia, unspecified: Secondary | ICD-10-CM

## 2016-11-03 LAB — COMPREHENSIVE METABOLIC PANEL
ALK PHOS: 48 U/L (ref 39–117)
ALT: 16 U/L (ref 0–35)
AST: 21 U/L (ref 0–37)
Albumin: 4.3 g/dL (ref 3.5–5.2)
BILIRUBIN TOTAL: 0.3 mg/dL (ref 0.2–1.2)
BUN: 13 mg/dL (ref 6–23)
CALCIUM: 9.7 mg/dL (ref 8.4–10.5)
CO2: 34 mEq/L — ABNORMAL HIGH (ref 19–32)
CREATININE: 0.86 mg/dL (ref 0.40–1.20)
Chloride: 96 mEq/L (ref 96–112)
GFR: 90.77 mL/min (ref >60.00)
GLUCOSE: 107 mg/dL — AB (ref 70–99)
Potassium: 3.4 mEq/L — ABNORMAL LOW (ref 3.5–5.1)
Sodium: 138 mEq/L (ref 135–145)
TOTAL PROTEIN: 7.7 g/dL (ref 6.0–8.3)

## 2016-11-03 LAB — LIPID PANEL
CHOL/HDL RATIO: 3
Cholesterol: 147 mg/dL (ref 0–200)
HDL: 43.6 mg/dL (ref >39.00)
LDL Cholesterol: 89 mg/dL (ref 0–99)
NONHDL: 103.61
Triglycerides: 71 mg/dL (ref 0.0–149.0)
VLDL: 14.2 mg/dL (ref 0.0–40.0)

## 2016-11-03 LAB — HEMOGLOBIN A1C: Hgb A1c MFr Bld: 6.8 % — ABNORMAL HIGH (ref 4.6–6.5)

## 2016-11-05 ENCOUNTER — Telehealth: Payer: Self-pay | Admitting: Medical

## 2016-11-05 ENCOUNTER — Encounter: Payer: Self-pay | Admitting: Medical

## 2016-11-05 MED ORDER — POTASSIUM CHLORIDE CRYS ER 20 MEQ PO TBCR
20.0000 meq | EXTENDED_RELEASE_TABLET | Freq: Every day | ORAL | 1 refills | Status: DC
Start: 2016-11-05 — End: 2018-01-09

## 2016-11-05 MED ORDER — ROSUVASTATIN CALCIUM 40 MG PO TABS: 40.0000 mg | ORAL_TABLET | Freq: Every day | ORAL | 1 refills | Status: DC

## 2016-11-05 MED ORDER — METFORMIN HCL 500 MG PO TABS: 500.0000 mg | ORAL_TABLET | Freq: Two times a day (BID) | ORAL | 0 refills | Status: DC

## 2016-11-05 NOTE — Telephone Encounter (Signed)
Pt rx send to pharmacy.

## 2016-11-07 ENCOUNTER — Other Ambulatory Visit: Payer: Self-pay

## 2016-11-07 MED ORDER — LEVOCETIRIZINE DIHYDROCHLORIDE 5 MG PO TABS: 5.0000 mg | ORAL_TABLET | Freq: Every evening | ORAL | 2 refills | Status: DC

## 2016-11-11 ENCOUNTER — Telehealth: Payer: Self-pay | Admitting: Medical

## 2016-11-11 MED ORDER — BENZONATATE 100 MG PO CAPS: 100.0000 mg | ORAL_CAPSULE | Freq: Three times a day (TID) | ORAL | 0 refills | Status: DC | PRN

## 2016-11-11 MED ORDER — FLUTICASONE PROPIONATE 50 MCG/ACT NA SUSP: 2.0000 | Freq: Every day | NASAL | 1 refills | Status: DC

## 2016-11-11 NOTE — Telephone Encounter (Signed)
I try to call patient but no answer.  She does have a history of allergies and with the information given will prescribe Flonase and benzonatate.  Flonase is for potential nasal congestion and benzonatate for her cough.  She does have some Xyzal and would recommend she take that.  If her symptoms worsen over the weekend then advise urgent care or ED.  If she is not improving by early next week then have her be seen appointment with me.

## 2016-11-11 NOTE — Telephone Encounter (Signed)
Left pt a messaging notifying her rx set to pharmacy

## 2016-11-11 NOTE — Telephone Encounter (Signed)
Pt called in she was prescribed a medication for a stuffy nose. She said now she have drainage and it is causing a cough. She said every year Christina Leblanc  prescribe her a medication for the drainage and a cough.   Pt is not sure of the name of medication.   Please advise

## 2016-12-16 ENCOUNTER — Other Ambulatory Visit: Payer: Self-pay | Admitting: Medical

## 2016-12-16 DIAGNOSIS — Z1231 Encounter for screening mammogram for malignant neoplasm of breast: Secondary | ICD-10-CM

## 2017-01-03 IMAGING — DX DG ANKLE COMPLETE 3+V*L*
3 series · 4 of 4 positions shown · non-contrast
Comparison: None.

CLINICAL DATA: Left medial ankle pain, swelling beginning during
the [REDACTED].

EXAM:
LEFT ANKLE COMPLETE - 3+ VIEW

[ankle ap]
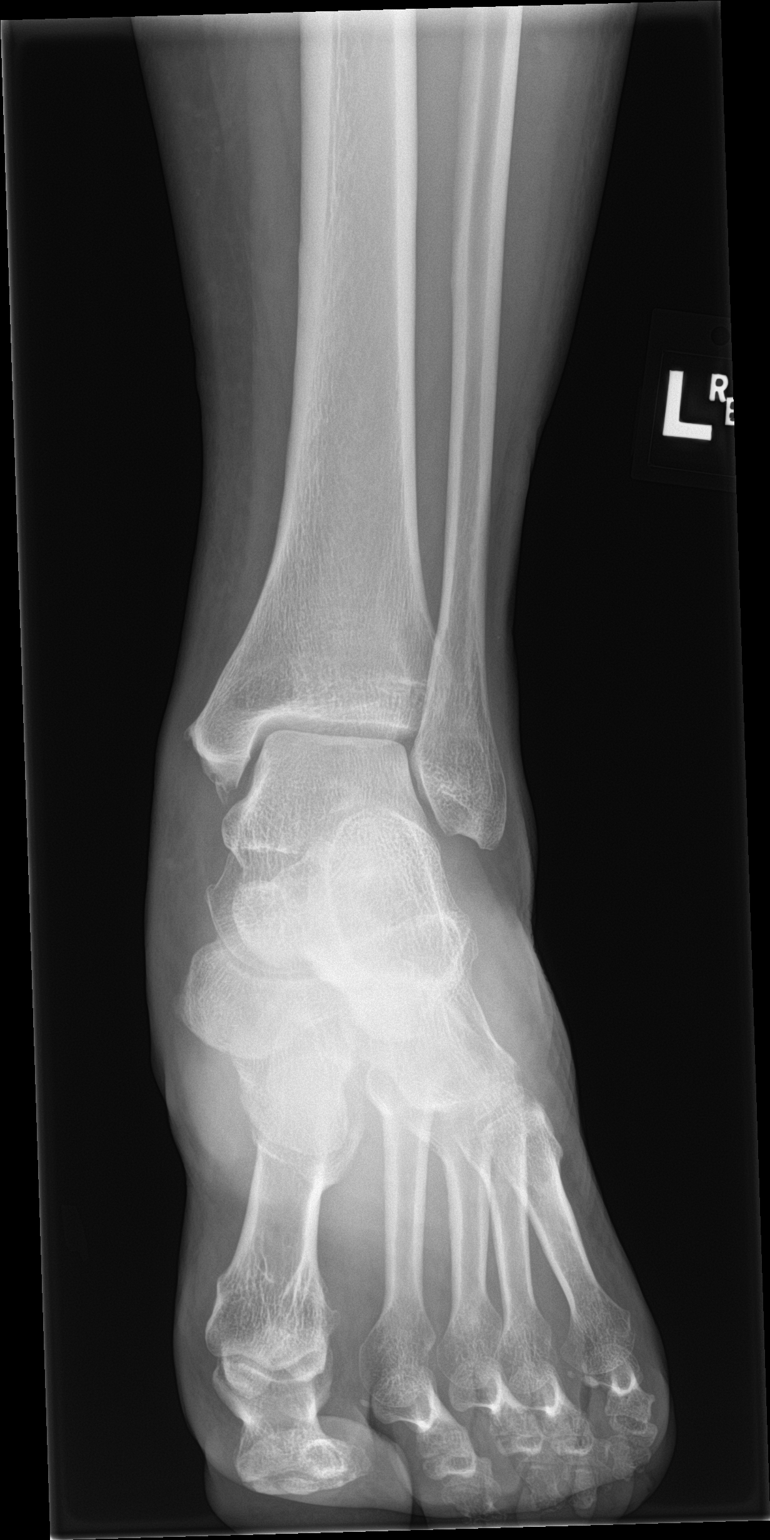

[Series 2: ankle obl · 0.14mm/px · 2 of 2 slices shown]
[im 1/2]
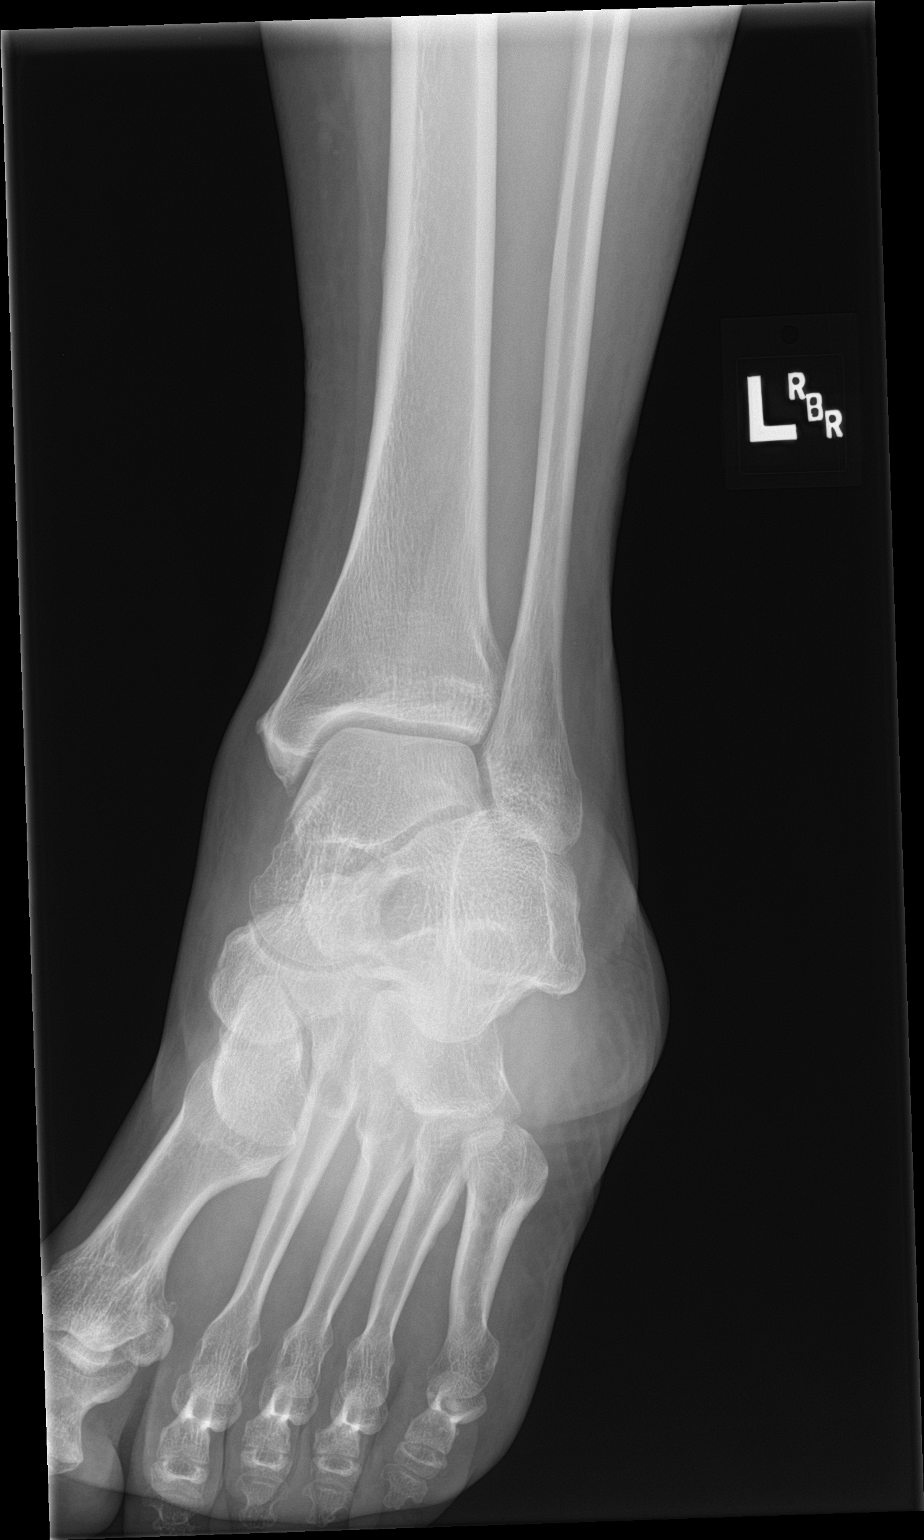
[im 2/2]
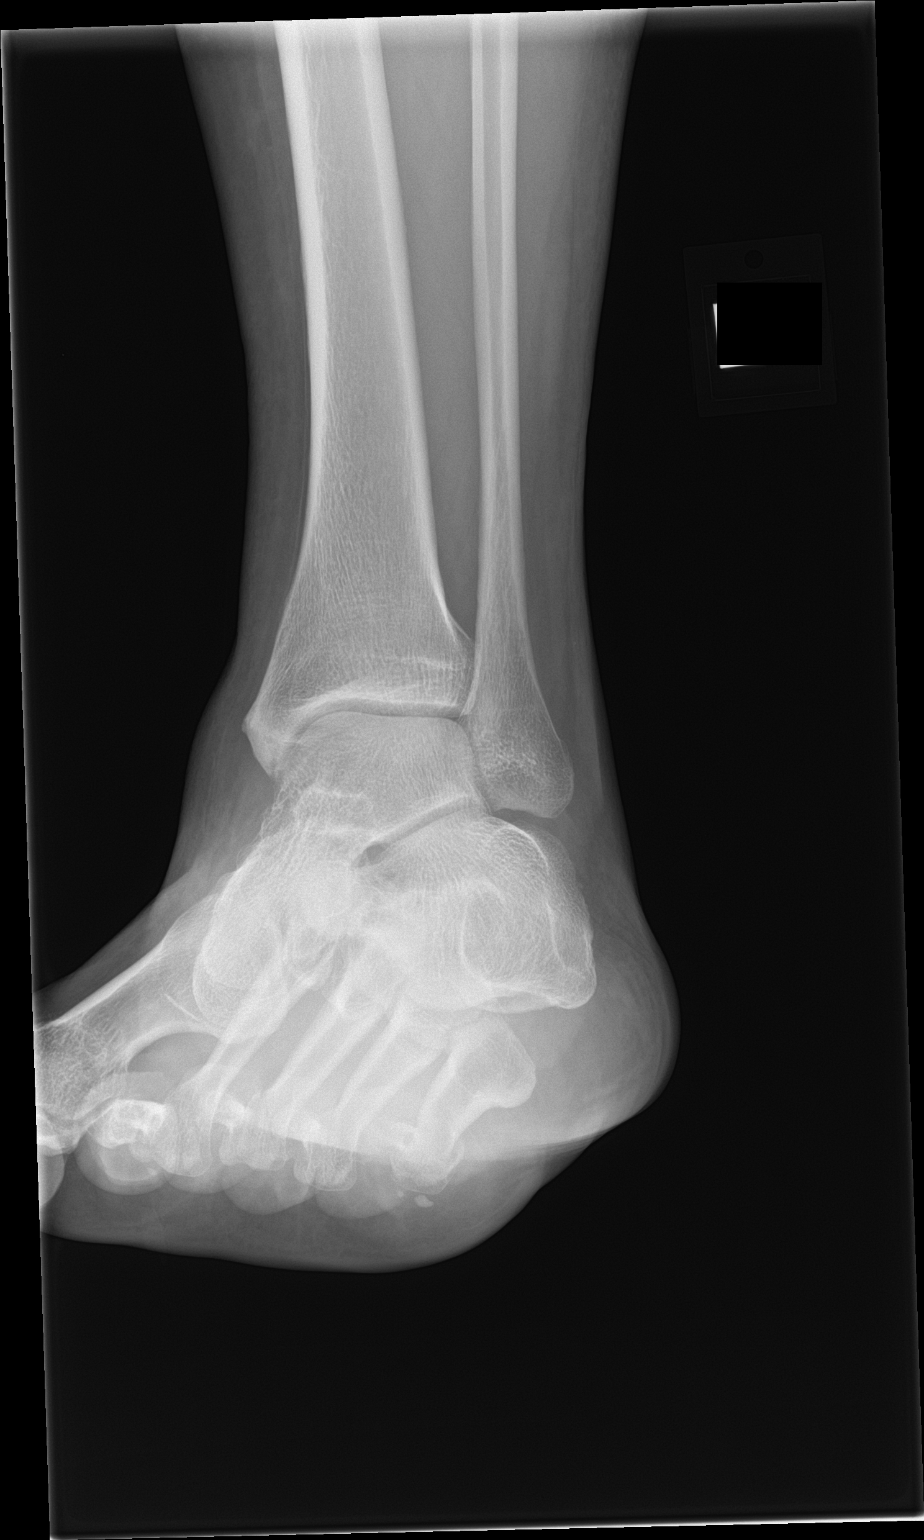

[ankle lat]
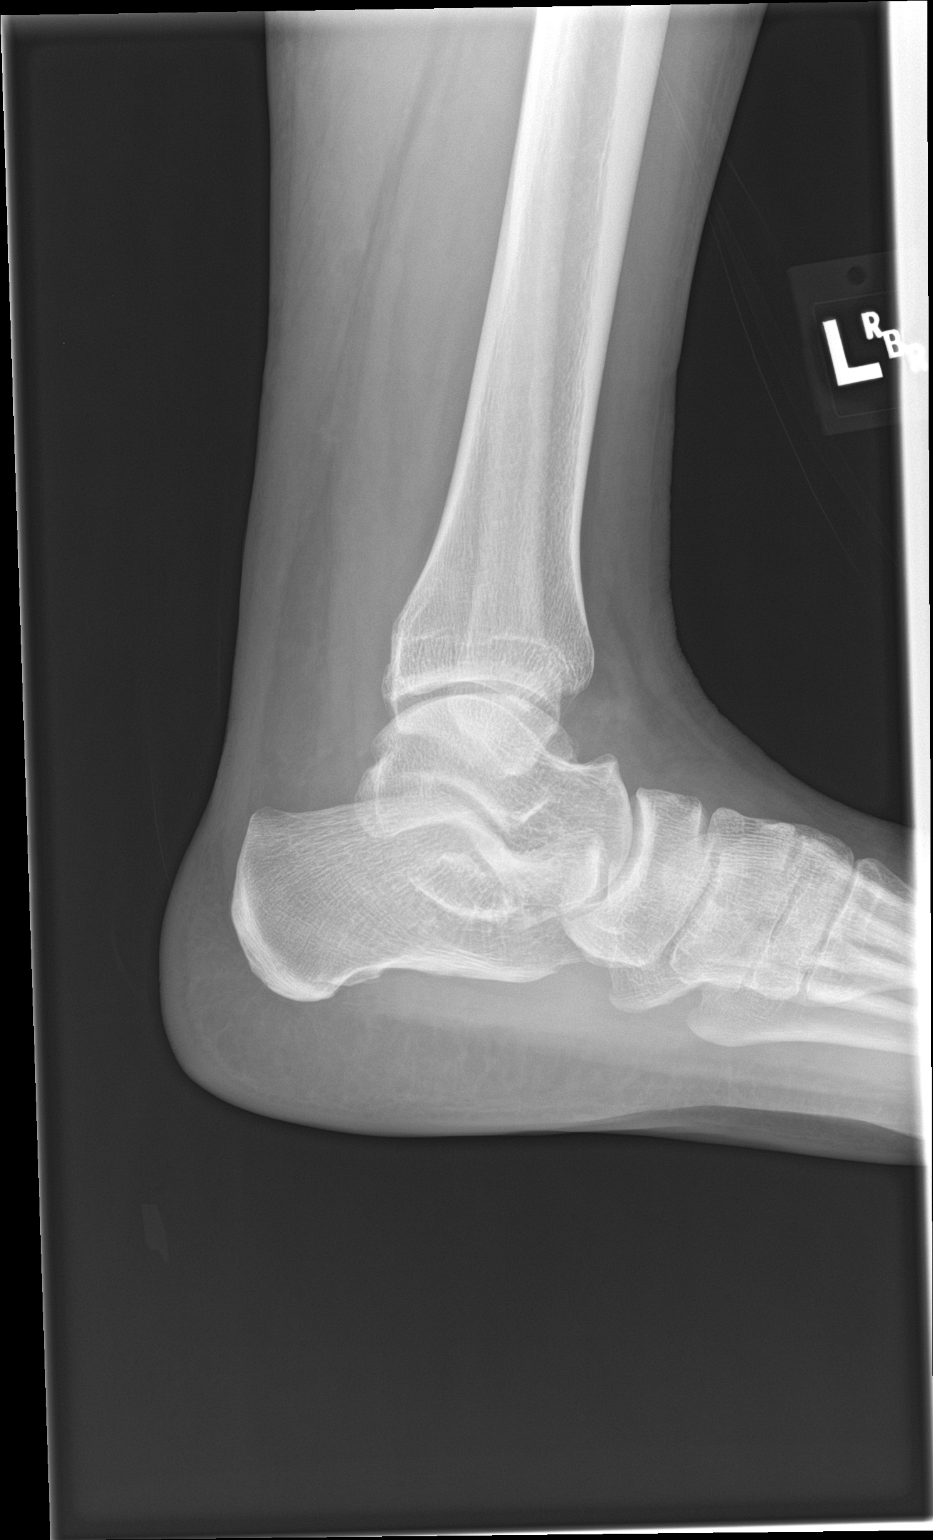

[4 of 4 positions shown; findings below may reference images not displayed]

FINDINGS: No acute bony abnormality. Specifically, no fracture, subluxation,
or dislocation. Soft tissues are intact.
IMPRESSION: No acute bony abnormality.

## 2017-02-08 ENCOUNTER — Ambulatory Visit (HOSPITAL_BASED_OUTPATIENT_CLINIC_OR_DEPARTMENT_OTHER)
Admission: RE | Admit: 2017-02-08 | Discharge: 2017-02-08 | Disposition: A | Discharge status: Home / Self Care | Payer: BC Managed Care – PPO | Source: Ambulatory Visit | Attending: Medical | Admitting: Medical

## 2017-02-08 DIAGNOSIS — Z1231 Encounter for screening mammogram for malignant neoplasm of breast: Secondary | ICD-10-CM | POA: Insufficient documentation

## 2017-03-07 ENCOUNTER — Telehealth: Payer: Self-pay | Admitting: Medical

## 2017-03-08 NOTE — Telephone Encounter (Signed)
Pt is due for follow up please call an schedule appointment.

## 2017-03-08 NOTE — Telephone Encounter (Signed)
Called pt and left message pt is due for a follow up appt.

## 2017-03-16 ENCOUNTER — Ambulatory Visit: Payer: BC Managed Care – PPO | Admitting: Medical

## 2017-03-23 ENCOUNTER — Ambulatory Visit: Payer: BC Managed Care – PPO | Admitting: Medical

## 2017-03-24 ENCOUNTER — Encounter: Payer: Self-pay | Admitting: Medical

## 2017-03-24 ENCOUNTER — Ambulatory Visit: Payer: BC Managed Care – PPO | Admitting: Medical

## 2017-03-24 VITALS — BP 140/90 | HR 89 | Temp 98.8°F | Resp 16 | Ht 67.0 in | Wt 218.0 lb

## 2017-03-24 DIAGNOSIS — Z8709 Personal history of other diseases of the respiratory system: Secondary | ICD-10-CM

## 2017-03-24 DIAGNOSIS — E785 Hyperlipidemia, unspecified: Secondary | ICD-10-CM

## 2017-03-24 DIAGNOSIS — I10 Essential (primary) hypertension: Secondary | ICD-10-CM

## 2017-03-24 DIAGNOSIS — E119 Type 2 diabetes mellitus without complications: Secondary | ICD-10-CM

## 2017-03-24 LAB — COMPREHENSIVE METABOLIC PANEL
ALT: 28 U/L (ref 0–35)
AST: 27 U/L (ref 0–37)
Albumin: 4.3 g/dL (ref 3.5–5.2)
Alkaline Phosphatase: 46 U/L (ref 39–117)
BUN: 8 mg/dL (ref 6–23)
CHLORIDE: 97 meq/L (ref 96–112)
CO2: 35 meq/L — AB (ref 19–32)
CREATININE: 0.81 mg/dL (ref 0.40–1.20)
Calcium: 9.7 mg/dL (ref 8.4–10.5)
GFR: 97.1 mL/min (ref >60.00)
Glucose, Bld: 107 mg/dL — ABNORMAL HIGH (ref 70–99)
POTASSIUM: 3.4 meq/L — AB (ref 3.5–5.1)
Sodium: 139 mEq/L (ref 135–145)
Total Bilirubin: 0.3 mg/dL (ref 0.2–1.2)
Total Protein: 7.5 g/dL (ref 6.0–8.3)

## 2017-03-24 LAB — LIPID PANEL
CHOLESTEROL: 153 mg/dL (ref 0–200)
HDL: 55.1 mg/dL (ref >39.00)
LDL Cholesterol: 84 mg/dL (ref 0–99)
NonHDL: 97.61
Total CHOL/HDL Ratio: 3
Triglycerides: 68 mg/dL (ref 0.0–149.0)
VLDL: 13.6 mg/dL (ref 0.0–40.0)

## 2017-03-24 LAB — HEMOGLOBIN A1C: HEMOGLOBIN A1C: 6.7 % — AB (ref 4.6–6.5)

## 2017-03-24 MED ORDER — LEVOCETIRIZINE DIHYDROCHLORIDE 5 MG PO TABS: 5.0000 mg | ORAL_TABLET | Freq: Every evening | ORAL | 2 refills | Status: DC

## 2017-03-24 MED ORDER — HYDROCHLOROTHIAZIDE 25 MG PO TABS: 25.0000 mg | ORAL_TABLET | Freq: Every day | ORAL | 1 refills | Status: DC

## 2017-03-24 MED ORDER — AMLODIPINE BESYLATE 5 MG PO TABS: 5.0000 mg | ORAL_TABLET | Freq: Every day | ORAL | 1 refills | Status: DC

## 2017-03-24 MED ORDER — ROSUVASTATIN CALCIUM 40 MG PO TABS: 40.0000 mg | ORAL_TABLET | Freq: Every day | ORAL | 1 refills | Status: DC

## 2017-03-24 NOTE — Progress Notes (Signed)
Subjective:    Patient ID: Christina Leblanc, female    DOB: 10-Dec-1970, 47 y.o.   MRN: [id number]  HPI  Pt in for follow up.  Pt bp little up today. Pt is getting bp readings 120-mid 130's/83-86. At home and relaxed. Morning readings are better. Pt checking about ever day.  Pt has not been exercising much. Pt states joined exercise at work program and plans to start.  No allergy symptoms recently. Hx of allergic rhinitis in spring. Pt has flonase. She has no xyzal presently.  Pt is fasting. She is diabetic and has high cholesterol     Review of Systems  Constitutional: Negative for chills, fatigue and fever.  HENT: Negative for congestion, ear pain, mouth sores, postnasal drip and rhinorrhea.   Respiratory: Negative for cough, chest tightness, shortness of breath and wheezing.   Cardiovascular: Negative for chest pain and palpitations.  Gastrointestinal: Negative for abdominal pain.  Genitourinary: Negative for difficulty urinating, flank pain, frequency and hematuria.  Musculoskeletal: Negative for back pain, gait problem and neck pain.  Skin: Negative for rash.  Neurological: Negative for dizziness, speech difficulty, weakness, numbness and headaches.  Hematological: Negative for adenopathy. Does not bruise/bleed easily.  Psychiatric/Behavioral: Negative for behavioral problems, decreased concentration, self-injury and suicidal ideas. The patient is not nervous/anxious.      Past Medical History:  Diagnosis Date  . Allergy   . Hyperlipidemia   . Hypertension      Social History   Socioeconomic History  . Marital status: Divorced    Spouse name: Not on file  . Number of children: Not on file  . Years of education: Not on file  . Highest education level: Not on file  Social Needs  . Financial resource strain: Not on file  . Food insecurity - worry: Not on file  . Food insecurity - inability: Not on file  . Transportation needs - medical: Not on file  .  Transportation needs - non-medical: Not on file  Occupational History  . Not on file  Tobacco Use  . Smoking status: Never Smoker  . Smokeless tobacco: Never Used  Substance and Sexual Activity  . Alcohol use: Yes    Alcohol/week: 0.0 oz    Comment: wine occasionally .Twice.  . Drug use: No  . Sexual activity: Not on file  Other Topics Concern  . Not on file  Social History Narrative  . Not on file    Past Surgical History:  Procedure Laterality Date  . ABDOMINAL HYSTERECTOMY     2003    Family History  Problem Relation Age of Onset  . Diabetes Father   . Heart disease Father   . Diabetes Mother   . Cancer Brother   . Hypertension Brother     Allergies  Allergen Reactions  . Claritin [Loratadine] Palpitations    Current Outpatient Medications on File Prior to Visit  Medication Sig Dispense Refill  . acetaminophen (TYLENOL) 500 MG tablet Take 500 mg by mouth every 6 (six) hours as needed.    Marland Kitchen amLODipine (NORVASC) 5 MG tablet Take 1 tablet (5 mg total) by mouth daily. 90 tablet 1  . benzonatate (TESSALON) 100 MG capsule Take 1 capsule (100 mg total) by mouth 3 (three) times daily as needed. 21 capsule 0  . cyclobenzaprine (FLEXERIL) 5 MG tablet Take 1 tablet (5 mg total) by mouth at bedtime. 10 tablet 0  . fluticasone (FLONASE) 50 MCG/ACT nasal spray Place 2 sprays into both nostrils daily.  16 g 1  . hydrochlorothiazide (HYDRODIURIL) 25 MG tablet Take 1 tablet (25 mg total) by mouth daily. 90 tablet 1  . levocetirizine (XYZAL) 5 MG tablet Take 1 tablet (5 mg total) by mouth every evening. 30 tablet 2  . meloxicam (MOBIC) 7.5 MG tablet Take 1 tablet (7.5 mg total) by mouth daily. 30 tablet 0  . metFORMIN (GLUCOPHAGE) 500 MG tablet Take 1 tablet (500 mg total) by mouth 2 (two) times daily with a meal. 180 tablet 0  . NON FORMULARY     . potassium chloride SA (K-DUR,KLOR-CON) 20 MEQ tablet Take 1 tablet (20 mEq total) by mouth daily. 90 tablet 1  . rosuvastatin  (CRESTOR) 40 MG tablet Take 1 tablet (40 mg total) by mouth daily. 90 tablet 1   No current facility-administered medications on file prior to visit.     BP (!) 146/85   Pulse 89   Temp 98.8 F (37.1 C) (Oral)   Resp 16   Ht 5\' 7"  (1.702 m)   Wt 218 lb (98.9 kg)   SpO2 99%   BMI 34.14 kg/m       Objective:   Physical Exam  General Mental Status- Alert. General Appearance- Not in acute distress.   Skin General: Color- Normal Color. Moisture- Normal Moisture.  Neck Carotid Arteries- Normal color. Moisture- Normal Moisture. No carotid bruits. No JVD.  Chest and Lung Exam Auscultation: Breath Sounds:-Normal.  Cardiovascular Auscultation:Rythm- Regular. Murmurs & Other Heart Sounds:Auscultation of the heart reveals- No Murmurs.  Abdomen Inspection:-Inspeection Normal. Palpation/Percussion:Note:No mass. Palpation and Percussion of the abdomen reveal- Non Tender, Non Distended + BS, no rebound or guarding.   Neurologic Cranial Nerve exam:- CN III-XII intact(No nystagmus), symmetric smile. Strength:- 5/5 equal and symmetric strength both upper and lower extremities.      Assessment & Plan:  Your blood pressure is a little high today/borderline when I checked.  However at home your blood pressures are much better.  It does appear that you do have a likely component of whitecoat hypertension.  Please continue to check your blood pressure at home and if your blood pressures are increasing above 140/90 please let us know we can modify your BP medication regimen.  For your history of diabetes and high cholesterol, we will get a fasting CMP, lipid panel and A1c.  I had planned to get urine microalbumin test today but you could just use the restroom.  Will try to get that next visit.  For your history of allergic rhinitis and the upcoming allergy season, I did go ahead and refill your Xyzal.  You have Flonase.  Would watch for any early allergy symptoms and start medication  sooner rather than waiting till your symptoms are severe.  Follow-up in 3 months or sooner depending on lab review.  Mackie Pai, PA-C

## 2017-03-24 NOTE — Patient Instructions (Signed)
Your blood pressure is a little high today/borderline when I checked.  However at home your blood pressures are much better.  It does appear that you do have a likely component of whitecoat hypertension.  Please continue to check your blood pressure at home and if your blood pressures are increasing above 140/90 please let us know we can modify your BP medication regimen.  For your history of diabetes and high cholesterol, we will get a fasting CMP, lipid panel and A1c.  I had planned to get urine microalbumin test today but you could just use the restroom.  Will try to get that next visit.  For your history of allergic rhinitis and the upcoming allergy season, I did go ahead and refill your Xyzal.  You have Flonase.  Would watch for any early allergy symptoms and start medication sooner rather than waiting till your symptoms are severe.  Follow-up in 3 months or sooner depending on lab review.

## 2017-03-25 ENCOUNTER — Telehealth: Payer: Self-pay | Admitting: Medical

## 2017-03-25 MED ORDER — METFORMIN HCL 500 MG PO TABS: 500.0000 mg | ORAL_TABLET | Freq: Two times a day (BID) | ORAL | 0 refills | Status: DC

## 2017-03-25 NOTE — Telephone Encounter (Signed)
Refilled metformin

## 2017-05-26 ENCOUNTER — Encounter: Payer: Self-pay | Admitting: Medical

## 2017-05-26 ENCOUNTER — Ambulatory Visit: Payer: BC Managed Care – PPO | Admitting: Medical

## 2017-05-26 VITALS — BP 131/75 | HR 99 | Temp 99.4°F | Resp 16 | Ht 67.0 in | Wt 214.2 lb

## 2017-05-26 DIAGNOSIS — J301 Allergic rhinitis due to pollen: Secondary | ICD-10-CM

## 2017-05-26 DIAGNOSIS — H9202 Otalgia, left ear: Secondary | ICD-10-CM

## 2017-05-26 DIAGNOSIS — J0100 Acute maxillary sinusitis, unspecified: Secondary | ICD-10-CM | POA: Diagnosis not present

## 2017-05-26 MED ORDER — AMOXICILLIN-POT CLAVULANATE 875-125 MG PO TABS: 1.0000 | ORAL_TABLET | Freq: Two times a day (BID) | ORAL | 0 refills | Status: DC

## 2017-05-26 MED ORDER — HYDROCODONE-HOMATROPINE 5-1.5 MG/5ML PO SYRP: 5.0000 mL | ORAL_SOLUTION | Freq: Three times a day (TID) | ORAL | 0 refills | Status: DC | PRN

## 2017-05-26 NOTE — Patient Instructions (Signed)
You do appear to have recent allergic rhinitis with possible early left ear infection and sinus infection.  I want you to restart your Xyzal and Flonase.  Advised stopping current over-the-counter medications you have been using.  For severe cough, I prescribed Hycodan.  Rx advisement given.  Also prescribed Augmentin antibiotic.  Please try to get plenty of rest this weekend.  Follow-up in 7 to 10 days or as needed.

## 2017-05-26 NOTE — Progress Notes (Signed)
Subjective:    Patient ID: Christina Leblanc, female    DOB: 27-Jul-1970, 47 y.o.   MRN: [id number]  HPI  Pt in with 2 days of nasal congestion, pnd and itchy throat. She has developed hacky cough.  Cough is dry. Occasional will cough clear mucus faint yellow tinge.  Left ear pressure.  Eyes little puffy and little watery.   Maybe faint sinus.   Pt has used clor-trimetron and mucinex. After using this felt some better.    Review of Systems  Constitutional: Positive for fever. Negative for chills and fatigue.       Subjective fever yesterday.  HENT: Positive for congestion, ear pain, postnasal drip and sinus pressure. Negative for sinus pain, sore throat and tinnitus.   Respiratory: Positive for cough. Negative for shortness of breath and wheezing.   Cardiovascular: Negative for chest pain and palpitations.  Gastrointestinal: Negative for abdominal pain, diarrhea and vomiting.  Musculoskeletal: Negative for back pain and neck pain.  Skin: Negative for rash.  Neurological: Negative for dizziness and headaches.  Hematological: Positive for adenopathy. Does not bruise/bleed easily.  Psychiatric/Behavioral: Negative for behavioral problems and dysphoric mood. The patient is not nervous/anxious and is not hyperactive.     Past Medical History:  Diagnosis Date  . Allergy   . Hyperlipidemia   . Hypertension      Social History   Socioeconomic History  . Marital status: Divorced    Spouse name: Not on file  . Number of children: Not on file  . Years of education: Not on file  . Highest education level: Not on file  Occupational History  . Not on file  Social Needs  . Financial resource strain: Not on file  . Food insecurity:    Worry: Not on file    Inability: Not on file  . Transportation needs:    Medical: Not on file    Non-medical: Not on file  Tobacco Use  . Smoking status: Never Smoker  . Smokeless tobacco: Never Used  Substance and Sexual Activity  .  Alcohol use: Yes    Alcohol/week: 0.0 oz    Comment: wine occasionally .Twice.  . Drug use: No  . Sexual activity: Not on file  Lifestyle  . Physical activity:    Days per week: Not on file    Minutes per session: Not on file  . Stress: Not on file  Relationships  . Social connections:    Talks on phone: Not on file    Gets together: Not on file    Attends religious service: Not on file    Active member of club or organization: Not on file    Attends meetings of clubs or organizations: Not on file    Relationship status: Not on file  . Intimate partner violence:    Fear of current or ex partner: Not on file    Emotionally abused: Not on file    Physically abused: Not on file    Forced sexual activity: Not on file  Other Topics Concern  . Not on file  Social History Narrative  . Not on file    Past Surgical History:  Procedure Laterality Date  . ABDOMINAL HYSTERECTOMY     2003    Family History  Problem Relation Age of Onset  . Diabetes Father   . Heart disease Father   . Diabetes Mother   . Cancer Brother   . Hypertension Brother     Allergies  Allergen Reactions  .  Claritin [Loratadine] Palpitations    Current Outpatient Medications on File Prior to Visit  Medication Sig Dispense Refill  . acetaminophen (TYLENOL) 500 MG tablet Take 500 mg by mouth every 6 (six) hours as needed.    Marland Kitchen amLODipine (NORVASC) 5 MG tablet Take 1 tablet (5 mg total) by mouth daily. 90 tablet 1  . benzonatate (TESSALON) 100 MG capsule Take 1 capsule (100 mg total) by mouth 3 (three) times daily as needed. 21 capsule 0  . cyclobenzaprine (FLEXERIL) 5 MG tablet Take 1 tablet (5 mg total) by mouth at bedtime. 10 tablet 0  . fluticasone (FLONASE) 50 MCG/ACT nasal spray Place 2 sprays into both nostrils daily. 16 g 1  . hydrochlorothiazide (HYDRODIURIL) 25 MG tablet Take 1 tablet (25 mg total) by mouth daily. 90 tablet 1  . levocetirizine (XYZAL) 5 MG tablet Take 1 tablet (5 mg total) by  mouth every evening. 30 tablet 2  . meloxicam (MOBIC) 7.5 MG tablet Take 1 tablet (7.5 mg total) by mouth daily. 30 tablet 0  . metFORMIN (GLUCOPHAGE) 500 MG tablet Take 1 tablet (500 mg total) by mouth 2 (two) times daily with a meal. 180 tablet 0  . NON FORMULARY     . potassium chloride SA (K-DUR,KLOR-CON) 20 MEQ tablet Take 1 tablet (20 mEq total) by mouth daily. 90 tablet 1  . rosuvastatin (CRESTOR) 40 MG tablet Take 1 tablet (40 mg total) by mouth daily. 90 tablet 1   No current facility-administered medications on file prior to visit.     BP 131/75   Pulse 99   Temp 99.4 F (37.4 C) (Oral)   Resp 16   Ht 5\' 7"  (1.702 m)   Wt 214 lb 3.2 oz (97.2 kg)   SpO2 99%   BMI 33.55 kg/m      Objective:   Physical Exam  General  Mental Status - Alert. General Appearance - Well groomed. Not in acute distress.  Skin Rashes- No Rashes.  HEENT Head- Normal. Ear Auditory Canal - Left- Normal. Right - Normal.Tympanic Membrane- Left-mild dull TM positive postnasal drainage. Right- Normal. Beneath left ear. Small palpable lymph node. Eye Sclera/Conjunctiva- Left- Normal. Right- Normal. Nose & Sinuses Nasal Mucosa- Left-  Boggy and Congested. Right-  Boggy and  Congested.Bilateral maxillary and frontal sinus pressure. Mouth & Throat Lips: Upper Lip- Normal: no dryness, cracking, pallor, cyanosis, or vesicular eruption. Lower Lip-Normal: no dryness, cracking, pallor, cyanosis or vesicular eruption. Buccal Mucosa- Bilateral- No Aphthous ulcers. Oropharynx- No Discharge or Erythema. +pnd.  Just beneath Tonsils: Characteristics- Bilateral- No Erythema or Congestion. Size/Enlargement- Bilateral- No enlargement. Discharge- bilateral-None.  Neck Neck- Supple. No Masses.   Chest and Lung Exam Auscultation: Breath Sounds:-Clear even and unlabored.  Cardiovascular Auscultation:Rythm- Regular, rate and rhythm. Murmurs & Other Heart Sounds:Ausculatation of the heart reveal- No  Murmurs.  Lymphatic Head & Neck General Head & Neck Lymphatics: Bilateral: Description- No Localized lymphadenopathy.see ear exam.       Assessment & Plan:  You do appear to have recent allergic rhinitis with possible early left ear infection and sinus infection.  I want you to restart your Xyzal and Flonase.  Advised stopping current over-the-counter medications you have been using.  For severe cough, I prescribed Hycodan.  Rx advisement given.  Also prescribed Augmentin antibiotic.  Please try to get plenty of rest this weekend.  Follow-up in 7 to 10 days or as needed.  Mackie Pai, PA-C

## 2017-06-21 ENCOUNTER — Ambulatory Visit: Payer: BC Managed Care – PPO

## 2017-06-27 ENCOUNTER — Ambulatory Visit (INDEPENDENT_AMBULATORY_CARE_PROVIDER_SITE_OTHER): Payer: BC Managed Care – PPO

## 2017-06-27 DIAGNOSIS — Z111 Encounter for screening for respiratory tuberculosis: Secondary | ICD-10-CM

## 2017-06-29 ENCOUNTER — Ambulatory Visit: Payer: BC Managed Care – PPO

## 2017-06-29 DIAGNOSIS — Z111 Encounter for screening for respiratory tuberculosis: Secondary | ICD-10-CM

## 2017-06-29 NOTE — Progress Notes (Addendum)
Pre visit review using our clinic tool,if applicable. No additional management support is needed unless otherwise documented below in the visit note.   Patient in for PPD reading. Negative  PPD Negative. Patient given Immunization report which reflects this.  Reviewed negative ppd result.  Mackie Pai, PA-C

## 2017-07-24 ENCOUNTER — Ambulatory Visit: Payer: Self-pay | Admitting: *Deleted

## 2017-07-24 NOTE — Telephone Encounter (Signed)
It has been 6 weeks since I last saw her.  If she feels that she needs antibiotic or narcotic-based cough syrup then she needs appointment.  Please get her scheduled sometime this week.

## 2017-07-24 NOTE — Telephone Encounter (Signed)
Pt also she had taken an antibiotic and hycodan syrup and that really helped the last time she was seen in May. She requested that her provider made aware of this.

## 2017-07-24 NOTE — Telephone Encounter (Signed)
Pt called with a question of what could she take for cough at night. She said that she started with allergies and now feels like she has a cold. She is using her nasal spray in the morning and taking her levocetirizine and benzonatate at night. She stated that she coughed so much her stomach hurt. Denies fever. She has an appointment scheduled with her pcp in the morning. Advised her to get more rest, drink plenty of fluids (including warm tea with lemon and honey), try taking 1 or 2 table spoons of honey at night before going to bed and also get cough drops with honey. And use a humidifier and elevate head of bed.  Pt voiced understanding. Will route to [facility].  Reason for Disposition . Caller has NON-URGENT medication question about med that PCP prescribed and triager unable to answer question  Answer Assessment - Initial Assessment Questions 1. SYMPTOMS: "Do you have any symptoms?"     Increased nasal drainage and coughing at night. 2. SEVERITY: If symptoms are present, ask "Are they mild, moderate or severe?"    moderate  Protocols used: MEDICATION QUESTION CALL-A-AH

## 2017-07-25 ENCOUNTER — Encounter: Payer: Self-pay | Admitting: Internal Medicine

## 2017-07-25 ENCOUNTER — Ambulatory Visit: Payer: BC Managed Care – PPO | Admitting: Internal Medicine

## 2017-07-25 VITALS — BP 124/76 | HR 91 | Temp 98.1°F | Resp 16 | Ht 67.0 in | Wt 211.4 lb

## 2017-07-25 DIAGNOSIS — J069 Acute upper respiratory infection, unspecified: Secondary | ICD-10-CM | POA: Diagnosis not present

## 2017-07-25 MED ORDER — HYDROCODONE-HOMATROPINE 5-1.5 MG/5ML PO SYRP: 5.0000 mL | ORAL_SOLUTION | Freq: Every evening | ORAL | 0 refills | Status: DC | PRN

## 2017-07-25 MED ORDER — AZELASTINE HCL 0.1 % NA SOLN: 2.0000 | Freq: Every evening | NASAL | 3 refills | Status: DC | PRN

## 2017-07-25 NOTE — Telephone Encounter (Signed)
Please call and schedule appointment.

## 2017-07-25 NOTE — Patient Instructions (Signed)
Rest, fluids , tylenol  For cough:  Take Mucinex DM, Coricidin or other OTCs as needed until better If the cough continue, take   hydrocodone  For nasal congestion: Use OTC Nasocort or Flonase : 2 nasal sprays on each side of the nose in the morning until you feel better Use ASTELIN a prescribed spray : 2 nasal sprays on each side of the nose at night until you feel better   Continue with Zyrtec   Call if not gradually better over the next  10 days  Call anytime if the symptoms are severe

## 2017-07-25 NOTE — Progress Notes (Signed)
Pre visit review using our clinic review tool, if applicable. No additional management support is needed unless otherwise documented below in the visit note. 

## 2017-07-25 NOTE — Progress Notes (Signed)
Subjective:    Patient ID: Christina Leblanc, female    DOB: October 06, 1970, 47 y.o.   MRN: [id number]  DOS:  07/25/2017 Type of visit - description : acute Interval history: Symptoms started last week, worse in the last 3 days, reports cough, postnasal dripping. OTCs helping a little, Coricidin did help her sleep. Last month, she was prescribed Tessalon Perles but they did not help.   Review of Systems Denies fever chills.  No nausea or vomiting No sore throat or sputum production.  No wheezing.  Past Medical History:  Diagnosis Date  . Allergy   . Hyperlipidemia   . Hypertension     Past Surgical History:  Procedure Laterality Date  . ABDOMINAL HYSTERECTOMY     2003    Social History   Socioeconomic History  . Marital status: Divorced    Spouse name: Not on file  . Number of children: Not on file  . Years of education: Not on file  . Highest education level: Not on file  Occupational History  . Not on file  Social Needs  . Financial resource strain: Not on file  . Food insecurity:    Worry: Not on file    Inability: Not on file  . Transportation needs:    Medical: Not on file    Non-medical: Not on file  Tobacco Use  . Smoking status: Never Smoker  . Smokeless tobacco: Never Used  Substance and Sexual Activity  . Alcohol use: Yes    Alcohol/week: 0.0 oz    Comment: wine occasionally .Twice.  . Drug use: No  . Sexual activity: Not on file  Lifestyle  . Physical activity:    Days per week: Not on file    Minutes per session: Not on file  . Stress: Not on file  Relationships  . Social connections:    Talks on phone: Not on file    Gets together: Not on file    Attends religious service: Not on file    Active member of club or organization: Not on file    Attends meetings of clubs or organizations: Not on file    Relationship status: Not on file  . Intimate partner violence:    Fear of current or ex partner: Not on file    Emotionally abused: Not on  file    Physically abused: Not on file    Forced sexual activity: Not on file  Other Topics Concern  . Not on file  Social History Narrative  . Not on file      Allergies as of 07/25/2017      Reactions   Claritin [loratadine] Palpitations      Medication List        Accurate as of 07/25/17  5:18 PM. Always use your most recent med list.          acetaminophen 500 MG tablet Commonly known as:  TYLENOL Take 500 mg by mouth every 6 (six) hours as needed.   amLODipine 5 MG tablet Commonly known as:  NORVASC Take 1 tablet (5 mg total) by mouth daily.   azelastine 0.1 % nasal spray Commonly known as:  ASTELIN Place 2 sprays into both nostrils at bedtime as needed for rhinitis. Use in each nostril as directed   cyclobenzaprine 5 MG tablet Commonly known as:  FLEXERIL Take 1 tablet (5 mg total) by mouth at bedtime.   fluticasone 50 MCG/ACT nasal spray Commonly known as:  FLONASE Place 2 sprays into  both nostrils daily.   hydrochlorothiazide 25 MG tablet Commonly known as:  HYDRODIURIL Take 1 tablet (25 mg total) by mouth daily.   HYDROcodone-homatropine 5-1.5 MG/5ML syrup Commonly known as:  HYCODAN Take 5 mLs by mouth at bedtime as needed.   levocetirizine 5 MG tablet Commonly known as:  XYZAL Take 1 tablet (5 mg total) by mouth every evening.   meloxicam 7.5 MG tablet Commonly known as:  MOBIC Take 1 tablet (7.5 mg total) by mouth daily.   metFORMIN 500 MG tablet Commonly known as:  GLUCOPHAGE Take 1 tablet (500 mg total) by mouth 2 (two) times daily with a meal.   NON FORMULARY   potassium chloride SA 20 MEQ tablet Commonly known as:  K-DUR,KLOR-CON Take 1 tablet (20 mEq total) by mouth daily.   rosuvastatin 40 MG tablet Commonly known as:  CRESTOR Take 1 tablet (40 mg total) by mouth daily.          Objective:   Physical Exam BP 124/76 (BP Location: Left Arm, Patient Position: Sitting, Cuff Size: Small)   Pulse 91   Temp 98.1 F (36.7 C)  (Oral)   Resp 16   Ht 5\' 7"  (1.702 m)   Wt 211 lb 6 oz (95.9 kg)   SpO2 97%   BMI 33.11 kg/m  General:   Well developed, NAD, see BMI.  HEENT:  Normocephalic . Face symmetric, atraumatic.  TMs slightly bulged but not red.  Nose is slightly congested.  Throat symmetric no red Lungs:  CTA B Normal respiratory effort, no intercostal retractions, no accessory muscle use. Heart: RRR,  no murmur.  No pretibial edema bilaterally  Skin: Not pale. Not jaundice Neurologic:  alert & oriented X3.  Speech normal, gait appropriate for age and unassisted Psych--  Cognition and judgment appear intact.  Cooperative with normal attention span and concentration.  Behavior appropriate. No anxious or depressed appearing.      Assessment & Plan:   URI:  47 year old female with history of HTN, DM, high cholesterol presents with URI symptoms. Recommend supportive treatment with fluids, Tylenol. Cough mngmt w/ OTCs, also hydrocodone, refill provided.  In the past Tessalon Perles did not help. Continue Flonase, and Astelin. Call if not improving.  ABX?

## 2017-07-25 NOTE — Telephone Encounter (Signed)
LVM to call the office to schedule an appt with Saguier concerning about her situation.

## 2017-07-26 ENCOUNTER — Ambulatory Visit: Payer: BC Managed Care – PPO | Admitting: Medical

## 2017-10-30 ENCOUNTER — Other Ambulatory Visit: Payer: Self-pay | Admitting: Medical

## 2017-11-27 ENCOUNTER — Other Ambulatory Visit: Payer: Self-pay | Admitting: Medical

## 2017-11-27 NOTE — Telephone Encounter (Signed)
Patient wants Saguier to know she is completely out of script.

## 2017-12-04 ENCOUNTER — Other Ambulatory Visit: Payer: Self-pay | Admitting: Medical

## 2017-12-04 NOTE — Telephone Encounter (Signed)
Patient states she wanted the office to know that she is completely out.

## 2017-12-07 ENCOUNTER — Other Ambulatory Visit: Payer: Self-pay | Admitting: Medical

## 2017-12-07 NOTE — Telephone Encounter (Signed)
Patient stated that she is completely out of her Metformin medication.

## 2017-12-12 ENCOUNTER — Encounter: Payer: Self-pay | Admitting: Medical

## 2017-12-12 ENCOUNTER — Ambulatory Visit: Payer: BC Managed Care – PPO | Admitting: Medical

## 2017-12-12 VITALS — BP 155/90 | HR 94 | Temp 98.2°F | Resp 16 | Ht 67.0 in | Wt 220.8 lb

## 2017-12-12 DIAGNOSIS — E785 Hyperlipidemia, unspecified: Secondary | ICD-10-CM

## 2017-12-12 DIAGNOSIS — E119 Type 2 diabetes mellitus without complications: Secondary | ICD-10-CM

## 2017-12-12 DIAGNOSIS — I10 Essential (primary) hypertension: Secondary | ICD-10-CM | POA: Diagnosis not present

## 2017-12-12 MED ORDER — AMLODIPINE BESYLATE 10 MG PO TABS: 10.0000 mg | ORAL_TABLET | Freq: Every day | ORAL | 0 refills | Status: DC

## 2017-12-12 MED ORDER — CHLORTHALIDONE 25 MG PO TABS: 25.0000 mg | ORAL_TABLET | Freq: Every day | ORAL | 3 refills | Status: DC

## 2017-12-12 NOTE — Patient Instructions (Signed)
Your blood pressure is better at home but still not ideally controlled.  Would prefer your blood pressure be closer to 130/80.  I prescribed you chlorthalidone 25 mg tablets.  This will take the place of HCTZ.  Continue current 5 mg dose of amlodipine.  Check your blood pressures daily and give [place] an update in 7 to 10 days on blood pressure readings.  If blood pressure is not consistently less than 140/90 with chlorthalidone then will need to increase amlodipine dose as well.  I am providing with a print prescription of amlodipine 10 mg but this will be a fold and hold prescription pending blood pressure checks over the next 7 to 10 days.  For her diabetes, we will check a metabolic panel and I6E.  These are future labs but I want you to get scheduled within the next week.  For high cholesterol, continue with current medication and will check lipid panel fasting with other labs.  Follow-up date to be determined after lab review.

## 2017-12-12 NOTE — Progress Notes (Signed)
Subjective:    Patient ID: Christina Leblanc, female    DOB: [birth year], 47 y.o.   MRN: [id number]  HPI  Pt is in for follow up.  Pt has htn. She states she seems to always gets nervous with her visits here. Pt states at home her reading 140's/80's range. No cardiac or neurologic signs or symptoms. Pt is on amlodipine and hctz.  Pt a1c in past was just a little high at 6.7 last check. Pt is on metformin.  Pt has high cholesterol and is on crestor. She is not fasting.   Review of Systems  Constitutional: Negative for chills, fatigue and fever.  HENT: Negative for congestion, ear pain, facial swelling and hearing loss.   Respiratory: Negative for cough, chest tightness, shortness of breath and wheezing.   Cardiovascular: Negative for chest pain and palpitations.  Gastrointestinal: Negative for abdominal pain.  Musculoskeletal: Negative for back pain, neck pain and neck stiffness.  Skin: Negative for rash.  Neurological: Negative for dizziness, weakness, numbness and headaches.  Hematological: Negative for adenopathy. Does not bruise/bleed easily.  Psychiatric/Behavioral: Negative for behavioral problems, confusion and hallucinations. The patient is not nervous/anxious.     Past Medical History:  Diagnosis Date  . Allergy   . Hyperlipidemia   . Hypertension      Social History   Socioeconomic History  . Marital status: Divorced    Spouse name: Not on file  . Number of children: Not on file  . Years of education: Not on file  . Highest education level: Not on file  Occupational History  . Not on file  Social Needs  . Financial resource strain: Not on file  . Food insecurity:    Worry: Not on file    Inability: Not on file  . Transportation needs:    Medical: Not on file    Non-medical: Not on file  Tobacco Use  . Smoking status: Never Smoker  . Smokeless tobacco: Never Used  Substance and Sexual Activity  . Alcohol use: Yes    Alcohol/week: 0.0 standard drinks      Comment: wine occasionally .Twice.  . Drug use: No  . Sexual activity: Not on file  Lifestyle  . Physical activity:    Days per week: Not on file    Minutes per session: Not on file  . Stress: Not on file  Relationships  . Social connections:    Talks on phone: Not on file    Gets together: Not on file    Attends religious service: Not on file    Active member of club or organization: Not on file    Attends meetings of clubs or organizations: Not on file    Relationship status: Not on file  . Intimate partner violence:    Fear of current or ex partner: Not on file    Emotionally abused: Not on file    Physically abused: Not on file    Forced sexual activity: Not on file  Other Topics Concern  . Not on file  Social History Narrative  . Not on file    Past Surgical History:  Procedure Laterality Date  . ABDOMINAL HYSTERECTOMY     2003    Family History  Problem Relation Age of Onset  . Diabetes Father   . Heart disease Father   . Diabetes Mother   . Cancer Brother   . Hypertension Brother     Allergies  Allergen Reactions  . Claritin [Loratadine] Palpitations  Current Outpatient Medications on File Prior to Visit  Medication Sig Dispense Refill  . acetaminophen (TYLENOL) 500 MG tablet Take 500 mg by mouth every 6 (six) hours as needed.    Marland Kitchen amLODipine (NORVASC) 5 MG tablet TAKE 1 TABLET BY MOUTH ONCE DAILY 90 tablet 1  . azelastine (ASTELIN) 0.1 % nasal spray Place 2 sprays into both nostrils at bedtime as needed for rhinitis. Use in each nostril as directed 30 mL 3  . cyclobenzaprine (FLEXERIL) 5 MG tablet Take 1 tablet (5 mg total) by mouth at bedtime. 10 tablet 0  . fluticasone (FLONASE) 50 MCG/ACT nasal spray Place 2 sprays into both nostrils daily. 16 g 1  . hydrochlorothiazide (HYDRODIURIL) 25 MG tablet Take 1 tablet (25 mg total) by mouth daily. 30 tablet 0  . HYDROcodone-homatropine (HYCODAN) 5-1.5 MG/5ML syrup Take 5 mLs by mouth at bedtime as  needed. 100 mL 0  . levocetirizine (XYZAL) 5 MG tablet Take 1 tablet (5 mg total) by mouth every evening. 30 tablet 2  . meloxicam (MOBIC) 7.5 MG tablet Take 1 tablet (7.5 mg total) by mouth daily. 30 tablet 0  . metFORMIN (GLUCOPHAGE) 500 MG tablet TAKE 1 TABLET BY MOUTH TWICE DAILY WITH MEALS 180 tablet 0  . NON FORMULARY     . potassium chloride SA (K-DUR,KLOR-CON) 20 MEQ tablet Take 1 tablet (20 mEq total) by mouth daily. 90 tablet 1  . rosuvastatin (CRESTOR) 40 MG tablet TAKE 1 TABLET BY MOUTH ONCE DAILY 90 tablet 1   No current facility-administered medications on file prior to visit.     BP (!) 151/94   Pulse 94   Temp 98.2 F (36.8 C) (Oral)   Resp 16   Ht 5\' 7"  (1.702 m)   Wt 220 lb 12.8 oz (100.2 kg)   SpO2 99%   BMI 34.58 kg/m       Objective:   Physical Exam General Mental Status- Alert. General Appearance- Not in acute distress.   Skin General: Color- Normal Color. Moisture- Normal Moisture.  Neck Carotid Arteries- Normal color. Moisture- Normal Moisture. No carotid bruits. No JVD.  Chest and Lung Exam Auscultation: Breath Sounds:-Normal.  Cardiovascular Auscultation:Rythm- Regular. Murmurs & Other Heart Sounds:Auscultation of the heart reveals- No Murmurs.  Abdomen Inspection:-Inspeection Normal. Palpation/Percussion:Note:No mass. Palpation and Percussion of the abdomen reveal- Non Tender, Non Distended + BS, no rebound or guarding.    Neurologic Cranial Nerve exam:- CN III-XII intact(No nystagmus), symmetric smile. Strength:- 5/5 equal and symmetric strength both upper and lower extremities.     Assessment & Plan:  Your blood pressure is better at home but still not ideally controlled.  Would prefer your blood pressure be closer to 130/80.  I prescribed you chlorthalidone 25 mg tablets.  This will take the place of HCTZ.  Continue current 5 mg dose of amlodipine.  Check your blood pressures daily and give [place] an update in 7 to 10 days on blood  pressure readings.  If blood pressure is not consistently less than 140/90 with chlorthalidone then will need to increase amlodipine dose as well.  I am providing with a print prescription of amlodipine 10 mg but this will be a fold and hold prescription pending blood pressure checks over the next 7 to 10 days.  For her diabetes, we will check a metabolic panel and D1V.  These are future labs but I want you to get scheduled within the next week.  For high cholesterol, continue with current medication and will check  lipid panel fasting with other labs.  Follow-up date to be determined after lab review.  Mackie Pai, PA-C

## 2017-12-13 ENCOUNTER — Telehealth: Payer: Self-pay | Admitting: Medical

## 2017-12-13 ENCOUNTER — Other Ambulatory Visit (INDEPENDENT_AMBULATORY_CARE_PROVIDER_SITE_OTHER): Payer: BC Managed Care – PPO

## 2017-12-13 DIAGNOSIS — E785 Hyperlipidemia, unspecified: Secondary | ICD-10-CM

## 2017-12-13 DIAGNOSIS — I10 Essential (primary) hypertension: Secondary | ICD-10-CM

## 2017-12-13 DIAGNOSIS — E119 Type 2 diabetes mellitus without complications: Secondary | ICD-10-CM | POA: Diagnosis not present

## 2017-12-13 LAB — COMPREHENSIVE METABOLIC PANEL
ALT: 17 U/L (ref 0–35)
AST: 20 U/L (ref 0–37)
Albumin: 4.4 g/dL (ref 3.5–5.2)
Alkaline Phosphatase: 54 U/L (ref 39–117)
BILIRUBIN TOTAL: 0.4 mg/dL (ref 0.2–1.2)
BUN: 11 mg/dL (ref 6–23)
CO2: 32 meq/L (ref 19–32)
CREATININE: 0.87 mg/dL (ref 0.40–1.20)
Calcium: 9.6 mg/dL (ref 8.4–10.5)
Chloride: 98 mEq/L (ref 96–112)
GFR: 89.14 mL/min (ref >60.00)
Glucose, Bld: 105 mg/dL — ABNORMAL HIGH (ref 70–99)
Potassium: 3.6 mEq/L (ref 3.5–5.1)
Sodium: 139 mEq/L (ref 135–145)
Total Protein: 7.5 g/dL (ref 6.0–8.3)

## 2017-12-13 LAB — HEMOGLOBIN A1C: Hgb A1c MFr Bld: 6.8 % — ABNORMAL HIGH (ref 4.6–6.5)

## 2017-12-13 LAB — LIPID PANEL
CHOL/HDL RATIO: 3
Cholesterol: 149 mg/dL (ref 0–200)
HDL: 46.4 mg/dL (ref >39.00)
LDL Cholesterol: 88 mg/dL (ref 0–99)
NONHDL: 102.22
Triglycerides: 72 mg/dL (ref 0.0–149.0)
VLDL: 14.4 mg/dL (ref 0.0–40.0)

## 2017-12-13 MED ORDER — METFORMIN HCL 1000 MG PO TABS: 1000.0000 mg | ORAL_TABLET | Freq: Two times a day (BID) | ORAL | 3 refills | Status: DC

## 2017-12-13 NOTE — Telephone Encounter (Signed)
Opened to review 

## 2017-12-13 NOTE — Telephone Encounter (Signed)
Rx metformin sent to pt pharmacy. 

## 2018-01-08 ENCOUNTER — Other Ambulatory Visit: Payer: Self-pay | Admitting: Medical

## 2018-01-25 ENCOUNTER — Ambulatory Visit: Payer: BC Managed Care – PPO | Admitting: Medical

## 2018-01-25 ENCOUNTER — Ambulatory Visit (HOSPITAL_BASED_OUTPATIENT_CLINIC_OR_DEPARTMENT_OTHER)
Admission: RE | Admit: 2018-01-25 | Discharge: 2018-01-25 | Disposition: A | Discharge status: Home / Self Care | Payer: BC Managed Care – PPO | Source: Ambulatory Visit | Attending: Medical | Admitting: Medical

## 2018-01-25 ENCOUNTER — Encounter: Payer: Self-pay | Admitting: Medical

## 2018-01-25 VITALS — BP 144/77 | HR 94 | Temp 98.6°F | Resp 16 | Ht 67.0 in | Wt 218.0 lb

## 2018-01-25 DIAGNOSIS — R059 Cough, unspecified: Secondary | ICD-10-CM

## 2018-01-25 DIAGNOSIS — J40 Bronchitis, not specified as acute or chronic: Secondary | ICD-10-CM | POA: Diagnosis not present

## 2018-01-25 DIAGNOSIS — R05 Cough: Secondary | ICD-10-CM | POA: Diagnosis not present

## 2018-01-25 MED ORDER — BENZONATATE 100 MG PO CAPS: 100.0000 mg | ORAL_CAPSULE | Freq: Three times a day (TID) | ORAL | 0 refills | Status: DC | PRN

## 2018-01-25 MED ORDER — AZITHROMYCIN 250 MG PO TABS: ORAL_TABLET | ORAL | 0 refills | Status: DC

## 2018-01-25 NOTE — Patient Instructions (Signed)
You appear to have bronchitis. Rest hydrate and tylenol for fever. I am prescribing cough medicine benzonatate , and azithromycin antibiotic.  If any recurrent nasal congestion then use Flonase.  You mention briefly that sometimes it appears that your cough is worse after eating.  So we will see how you respond to the above treatment.  If cough becomes a predominant every time he ate then might consider cimetidine to treat potential reflux.  Also you mention early on some mild wheezing 3 weeks ago.  Watch for any recurrences of wheezing as sometimes cough can be associated with a tight airways.  Please get chest x-ray today.  Follow up in 7-10 days or as needed

## 2018-01-25 NOTE — Progress Notes (Signed)
Subjective:    Patient ID: Christina Leblanc, female    DOB: 1970-11-05, 48 y.o.   MRN: [id number]  HPI  Pt had some cough for 3 weeks. Pt states has tried come corcidin and other otc meds. But her cough is not improving. Some cough at times after she eats. Pt states occasionally gets up mucus. No fever, no chills or sweats presently. Then states one day early on felt chills and fever.  No obvious sinus pressure. Some nasal congestion early on.  Pt early on felt transient mild wheeze on 1st week.   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  Pt tried xyzal early on and it did not help much. She did not use flonase.   Review of Systems  Constitutional: Negative for chills, fatigue and fever.  HENT: Positive for congestion and postnasal drip. Negative for sinus pressure, sinus pain, sneezing and sore throat.        See hpi.  Respiratory: Positive for cough and wheezing. Negative for shortness of breath.        Some wheezing  Cardiovascular: Negative for chest pain and palpitations.  Gastrointestinal: Negative for abdominal pain.  Musculoskeletal: Negative for back pain.  Neurological: Negative for dizziness and headaches.  Hematological: Negative for adenopathy. Does not bruise/bleed easily.  Psychiatric/Behavioral: Negative for behavioral problems.    Past Medical History:  Diagnosis Date  . Allergy   . Hyperlipidemia   . Hypertension      Social History   Socioeconomic History  . Marital status: Divorced    Spouse name: Not on file  . Number of children: Not on file  . Years of education: Not on file  . Highest education level: Not on file  Occupational History  . Not on file  Social Needs  . Financial resource strain: Not on file  . Food insecurity:    Worry: Not on file    Inability: Not on file  . Transportation needs:    Medical: Not on file    Non-medical: Not on file  Tobacco Use  . Smoking status: Never Smoker  . Smokeless  tobacco: Never Used  Substance and Sexual Activity  . Alcohol use: Yes    Alcohol/week: 0.0 standard drinks    Comment: wine occasionally .Twice.  . Drug use: No  . Sexual activity: Not on file  Lifestyle  . Physical activity:    Days per week: Not on file    Minutes per session: Not on file  . Stress: Not on file  Relationships  . Social connections:    Talks on phone: Not on file    Gets together: Not on file    Attends religious service: Not on file    Active member of club or organization: Not on file    Attends meetings of clubs or organizations: Not on file    Relationship status: Not on file  . Intimate partner violence:    Fear of current or ex partner: Not on file    Emotionally abused: Not on file    Physically abused: Not on file    Forced sexual activity: Not on file  Other Topics Concern  . Not on file  Social History Narrative  . Not on file    Past Surgical History:  Procedure Laterality Date  . ABDOMINAL HYSTERECTOMY     2003    Family History  Problem Relation Age of Onset  . Diabetes Father   . Heart disease  Father   . Diabetes Mother   . Cancer Brother   . Hypertension Brother     Allergies  Allergen Reactions  . Claritin [Loratadine] Palpitations    Current Outpatient Medications on File Prior to Visit  Medication Sig Dispense Refill  . acetaminophen (TYLENOL) 500 MG tablet Take 500 mg by mouth every 6 (six) hours as needed.    Marland Kitchen amLODipine (NORVASC) 10 MG tablet Take 1 tablet (10 mg total) by mouth daily. 30 tablet 0  . amLODipine (NORVASC) 5 MG tablet TAKE 1 TABLET BY MOUTH ONCE DAILY 90 tablet 1  . azelastine (ASTELIN) 0.1 % nasal spray Place 2 sprays into both nostrils at bedtime as needed for rhinitis. Use in each nostril as directed 30 mL 3  . chlorthalidone (HYGROTON) 25 MG tablet Take 1 tablet (25 mg total) by mouth daily. 30 tablet 3  . cyclobenzaprine (FLEXERIL) 5 MG tablet Take 1 tablet (5 mg total) by mouth at bedtime. 10  tablet 0  . fluticasone (FLONASE) 50 MCG/ACT nasal spray Place 2 sprays into both nostrils daily. 16 g 1  . HYDROcodone-homatropine (HYCODAN) 5-1.5 MG/5ML syrup Take 5 mLs by mouth at bedtime as needed. 100 mL 0  . levocetirizine (XYZAL) 5 MG tablet Take 1 tablet (5 mg total) by mouth every evening. 30 tablet 2  . meloxicam (MOBIC) 7.5 MG tablet Take 1 tablet (7.5 mg total) by mouth daily. 30 tablet 0  . metFORMIN (GLUCOPHAGE) 1000 MG tablet Take 1 tablet (1,000 mg total) by mouth 2 (two) times daily with a meal. 180 tablet 3  . NON FORMULARY     . potassium chloride SA (K-DUR,KLOR-CON) 20 MEQ tablet TAKE 1 TABLET BY MOUTH ONCE DAILY 30 tablet 0  . rosuvastatin (CRESTOR) 40 MG tablet TAKE 1 TABLET BY MOUTH ONCE DAILY 90 tablet 1   No current facility-administered medications on file prior to visit.     BP (!) 144/77   Pulse 94   Temp 98.6 F (37 C) (Oral)   Resp 16   Ht 5\' 7"  (1.702 m)   Wt 218 lb (98.9 kg)   SpO2 100%   BMI 34.14 kg/m       Objective:   Physical Exam  General  Mental Status - Alert. General Appearance - Well groomed. Not in acute distress.  Skin Rashes- No Rashes.  HEENT Head- Normal. Ear Auditory Canal - Left- Normal. Right - Normal.Tympanic Membrane- Left- Normal. Right- Normal. Eye Sclera/Conjunctiva- Left- Normal. Right- Normal. Nose & Sinuses Nasal Mucosa- Left-  Boggy and Congested. Right-  Boggy and  Congested.Bilateral no  maxillary and  No frontal sinus pressure. Mouth & Throat Lips: Upper Lip- Normal: no dryness, cracking, pallor, cyanosis, or vesicular eruption. Lower Lip-Normal: no dryness, cracking, pallor, cyanosis or vesicular eruption. Buccal Mucosa- Bilateral- No Aphthous ulcers. Oropharynx- No Discharge or Erythema. +pnd. Tonsils: Characteristics- Bilateral- No Erythema or Congestion. Size/Enlargement- Bilateral- No enlargement. Discharge- bilateral-None.  Neck Neck- Supple. No Masses.   Chest and Lung Exam Auscultation: Breath  Sounds:-Clear even and unlabored.  Cardiovascular Auscultation:Rythm- Regular, rate and rhythm. Murmurs & Other Heart Sounds:Ausculatation of the heart reveal- No Murmurs.  Lymphatic Head & Neck General Head & Neck Lymphatics: Bilateral: Description- No Localized lymphadenopathy.       Assessment & Plan:  You appear to have bronchitis. Rest hydrate and tylenol for fever. I am prescribing cough medicine benzonatate , and azithromycin antibiotic.  If any recurrent nasal congestion then use Flonase.  You mention briefly that sometimes  it appears that your cough is worse after eating.  So we will see how you respond to the above treatment.  If cough becomes a predominant every time he ate then might consider cimetidine to treat potential reflux.  Also you mention early on some mild wheezing 3 weeks ago.  Watch for any recurrences of wheezing as sometimes cough can be associated with a tight airways.  Please get chest x-ray today.  Follow up in 7-10 days or as needed  [employer], [employer]

## 2018-01-26 ENCOUNTER — Telehealth: Payer: Self-pay | Admitting: Medical

## 2018-01-26 NOTE — Telephone Encounter (Signed)
Printed copy of XR for provider to review.

## 2018-01-26 NOTE — Telephone Encounter (Signed)
Copied from [place] [id number]. Topic: Quick Communication - See Telephone Encounter >> Jan 26, 2018 12:20 PM Rutherford Nail, NT wrote: CRM for notification. See Telephone encounter for: 01/26/18. Patient calling and would like a call with her chest x-ray results from 01/25/2018. Please advise.

## 2018-01-31 ENCOUNTER — Ambulatory Visit: Payer: BC Managed Care – PPO | Admitting: Medical

## 2018-01-31 ENCOUNTER — Encounter: Payer: Self-pay | Admitting: Medical

## 2018-01-31 VITALS — BP 129/76 | HR 93 | Temp 98.6°F | Resp 16 | Ht 67.0 in | Wt 216.0 lb

## 2018-01-31 DIAGNOSIS — L089 Local infection of the skin and subcutaneous tissue, unspecified: Secondary | ICD-10-CM

## 2018-01-31 DIAGNOSIS — M79609 Pain in unspecified limb: Secondary | ICD-10-CM | POA: Diagnosis not present

## 2018-01-31 MED ORDER — DOXYCYCLINE HYCLATE 100 MG PO TABS: 100.0000 mg | ORAL_TABLET | Freq: Two times a day (BID) | ORAL | 0 refills | Status: DC

## 2018-01-31 NOTE — Progress Notes (Signed)
Subjective:    Patient ID: Christina Leblanc, female    DOB: 1970-08-16, 48 y.o.   MRN: [id number]  HPI  Pt in with some recent redness, warmth and tenderness to rt medial distal thigh area. Also pt notes one small red bump posterior calf. Pt mentions calf was itching yesterday morning. Later the day medial thigh and popliteal fossa redness and warmth. No sob.  Pt put some rubbing alcohol to the area on medial thigh. Swelling and redness is less.   Review of Systems  Constitutional: Negative for chills, fatigue and fever.  Respiratory: Negative for cough, chest tightness, shortness of breath and wheezing.   Cardiovascular: Negative for chest pain and palpitations.  Skin: Positive for rash.  Neurological: Negative for dizziness and headaches.  Hematological: Negative for adenopathy. Does not bruise/bleed easily.  Psychiatric/Behavioral: Negative for behavioral problems, confusion and suicidal ideas. The patient is not nervous/anxious.    Past Medical History:  Diagnosis Date  . Allergy   . Hyperlipidemia   . Hypertension      Social History   Socioeconomic History  . Marital status: Divorced    Spouse name: Not on file  . Number of children: Not on file  . Years of education: Not on file  . Highest education level: Not on file  Occupational History  . Not on file  Social Needs  . Financial resource strain: Not on file  . Food insecurity:    Worry: Not on file    Inability: Not on file  . Transportation needs:    Medical: Not on file    Non-medical: Not on file  Tobacco Use  . Smoking status: Never Smoker  . Smokeless tobacco: Never Used  Substance and Sexual Activity  . Alcohol use: Yes    Alcohol/week: 0.0 standard drinks    Comment: wine occasionally .Twice.  . Drug use: No  . Sexual activity: Not on file  Lifestyle  . Physical activity:    Days per week: Not on file    Minutes per session: Not on file  . Stress: Not on file  Relationships  . Social  connections:    Talks on phone: Not on file    Gets together: Not on file    Attends religious service: Not on file    Active member of club or organization: Not on file    Attends meetings of clubs or organizations: Not on file    Relationship status: Not on file  . Intimate partner violence:    Fear of current or ex partner: Not on file    Emotionally abused: Not on file    Physically abused: Not on file    Forced sexual activity: Not on file  Other Topics Concern  . Not on file  Social History Narrative  . Not on file    Past Surgical History:  Procedure Laterality Date  . ABDOMINAL HYSTERECTOMY     2003    Family History  Problem Relation Age of Onset  . Diabetes Father   . Heart disease Father   . Diabetes Mother   . Cancer Brother   . Hypertension Brother     Allergies  Allergen Reactions  . Claritin [Loratadine] Palpitations    Current Outpatient Medications on File Prior to Visit  Medication Sig Dispense Refill  . acetaminophen (TYLENOL) 500 MG tablet Take 500 mg by mouth every 6 (six) hours as needed.    Marland Kitchen amLODipine (NORVASC) 10 MG tablet Take 1 tablet (10  mg total) by mouth daily. 30 tablet 0  . amLODipine (NORVASC) 5 MG tablet TAKE 1 TABLET BY MOUTH ONCE DAILY 90 tablet 1  . azelastine (ASTELIN) 0.1 % nasal spray Place 2 sprays into both nostrils at bedtime as needed for rhinitis. Use in each nostril as directed 30 mL 3  . azithromycin (ZITHROMAX) 250 MG tablet Take 2 tablets by mouth on day 1, followed by 1 tablet by mouth daily for 4 days. 6 tablet 0  . benzonatate (TESSALON) 100 MG capsule Take 1 capsule (100 mg total) by mouth 3 (three) times daily as needed for cough. 30 capsule 0  . chlorthalidone (HYGROTON) 25 MG tablet Take 1 tablet (25 mg total) by mouth daily. 30 tablet 3  . cyclobenzaprine (FLEXERIL) 5 MG tablet Take 1 tablet (5 mg total) by mouth at bedtime. 10 tablet 0  . fluticasone (FLONASE) 50 MCG/ACT nasal spray Place 2 sprays into both  nostrils daily. 16 g 1  . HYDROcodone-homatropine (HYCODAN) 5-1.5 MG/5ML syrup Take 5 mLs by mouth at bedtime as needed. 100 mL 0  . levocetirizine (XYZAL) 5 MG tablet Take 1 tablet (5 mg total) by mouth every evening. 30 tablet 2  . meloxicam (MOBIC) 7.5 MG tablet Take 1 tablet (7.5 mg total) by mouth daily. 30 tablet 0  . metFORMIN (GLUCOPHAGE) 1000 MG tablet Take 1 tablet (1,000 mg total) by mouth 2 (two) times daily with a meal. 180 tablet 3  . NON FORMULARY     . potassium chloride SA (K-DUR,KLOR-CON) 20 MEQ tablet TAKE 1 TABLET BY MOUTH ONCE DAILY 30 tablet 0  . rosuvastatin (CRESTOR) 40 MG tablet TAKE 1 TABLET BY MOUTH ONCE DAILY 90 tablet 1   No current facility-administered medications on file prior to visit.     BP 129/76   Pulse 93   Temp 98.6 F (37 C) (Oral)   Resp 16   Ht 5\' 7"  (1.702 m)   Wt 216 lb (98 kg)   SpO2 98%   BMI 33.83 kg/m       Objective:   Physical Exam  General- No acute distress. Pleasant patient. Neck- Full range of motion, no jvd Lungs- Clear, even and unlabored. Heart- regular rate and rhythm. Neurologic- CNII- XII grossly intact.  Rt lower leg- medial and distal thigh 2.5 cm x 10 cm faint red area with mild swelling and warmth. Also some warmth in popliteal area(some induration). On small raised bump rt calf posterior aspect.      Assessment & Plan:  By your recent description yesterday and physical exam today, it does appear that you likely have a skin infection/early cellulitis.  I am going to prescribe doxycycline to take twice daily for 7 days.  Rx advisement given.  Some concern that you had some popliteal pain recently.  Also the area feels slightly bulged and indurated presently.  For caution sake I placed ultrasound order of your right lower extremity.  Want you to go downstairs and try to schedule that today or tomorrow.  If you have problems scheduling tomorrow please let me know and we could find other location.  Follow-up in 7  days or as needed.  Mackie Pai, PA-C

## 2018-01-31 NOTE — Patient Instructions (Signed)
By your recent description yesterday and physical exam today, it does appear that you likely have a skin infection/early cellulitis.  I am going to prescribe doxycycline to take twice daily for 7 days.  Rx advisement given.  Some concern that you had some popliteal pain recently.  Also the area feels slightly bulged and indurated presently.  For caution sake I placed ultrasound order of your right lower extremity.  Want you to go downstairs and try to schedule that today or tomorrow.  If you have problems scheduling tomorrow please let me know and we could find other location.  Follow-up in 7 days or as needed.

## 2018-02-01 ENCOUNTER — Ambulatory Visit (HOSPITAL_BASED_OUTPATIENT_CLINIC_OR_DEPARTMENT_OTHER)
Admission: RE | Admit: 2018-02-01 | Discharge: 2018-02-01 | Disposition: A | Discharge status: Home / Self Care | Payer: BC Managed Care – PPO | Source: Ambulatory Visit | Attending: Medical | Admitting: Medical

## 2018-02-01 DIAGNOSIS — M79609 Pain in unspecified limb: Secondary | ICD-10-CM | POA: Diagnosis not present

## 2018-02-02 ENCOUNTER — Telehealth: Payer: Self-pay | Admitting: Medical

## 2018-02-02 NOTE — Telephone Encounter (Unsigned)
Copied from [place]. Topic: Quick Communication - Lab Results (Clinic Use ONLY) >> Feb 02, 2018  1:43 PM Hinton Dyer, [place] wrote: Called patient to inform them of 02/01/18 lab results. When patient returns call, triage nurse may disclose results. >> Feb 02, 2018  3:04 PM Yvette Rack wrote: Pt called in for lab results. Pt requests call back. Cb# [phone]

## 2018-02-02 NOTE — Telephone Encounter (Signed)
Returned call to pt. to give results; see result note on LE venous doppler.

## 2018-03-12 ENCOUNTER — Other Ambulatory Visit: Payer: Self-pay | Admitting: Medical

## 2018-04-08 ENCOUNTER — Other Ambulatory Visit: Payer: Self-pay | Admitting: Medical

## 2018-04-21 ENCOUNTER — Other Ambulatory Visit: Payer: Self-pay | Admitting: Medical

## 2018-05-03 ENCOUNTER — Telehealth: Payer: Self-pay | Admitting: *Deleted

## 2018-05-03 NOTE — Telephone Encounter (Signed)
Copied from [place] [id number]. Topic: Appointment Scheduling - Scheduling Inquiry for Clinic >> May 02, 2018  4:28 PM Valla Leaver wrote: Reason for CRM: Needs appt .

## 2018-05-03 NOTE — Telephone Encounter (Signed)
Attempted to reach pt re: need to schedule appt and left detailed message to call us back and schedule appt.

## 2018-05-04 NOTE — Telephone Encounter (Signed)
Spoke with pt and schedule f/u for 4/20 at 1pm. Pt said she can do this on her lunch break from 1 to 1:30. She is working from home.

## 2018-05-07 ENCOUNTER — Other Ambulatory Visit: Payer: Self-pay

## 2018-05-07 ENCOUNTER — Encounter: Payer: Self-pay | Admitting: Medical

## 2018-05-07 ENCOUNTER — Ambulatory Visit (INDEPENDENT_AMBULATORY_CARE_PROVIDER_SITE_OTHER): Payer: BC Managed Care – PPO | Admitting: Medical

## 2018-05-07 VITALS — BP 127/80

## 2018-05-07 DIAGNOSIS — E785 Hyperlipidemia, unspecified: Secondary | ICD-10-CM | POA: Diagnosis not present

## 2018-05-07 DIAGNOSIS — E119 Type 2 diabetes mellitus without complications: Secondary | ICD-10-CM | POA: Diagnosis not present

## 2018-05-07 DIAGNOSIS — I10 Essential (primary) hypertension: Secondary | ICD-10-CM | POA: Diagnosis not present

## 2018-05-07 NOTE — Progress Notes (Signed)
   Subjective:    Patient ID: Christina Leblanc, female    DOB: Jul 29, 1970, 48 y.o.   MRN: [id number]  HPI  Virtual Visit via Video Note  I connected with Christina Leblanc on 05/07/18 at  1:00 PM EDT by a video enabled telemedicine application and verified that I am speaking with the correct person using two identifiers.   I discussed the limitations of evaluation and management by telemedicine and the availability of in person appointments. The patient expressed understanding and agreed to proceed.    History of Present Illness:   Pt states her bp have been 120/80 most of time. Sometimes down to 78 diastolic. She states has been better since working at home. She feels less stressed and walking.  Pt has diabetes and she is taking metformin.  Pt cholesterol has looked good in the past. She is on crestor   Observations/Objective:  No acute distress.    Assessment and Plan: Pt bp is well controlled today. Continue current treatment regimen.  For diabetes continue metformin and low sugar diet. Will get a1c soon.  For high cholesterol continue crestor and will get lipid panel.  Pt will future labs. To get labs done next week.  Follow up in approximate 3.5 months.  Follow Up Instructions:    I discussed the assessment and treatment plan with the patient. The patient was provided an opportunity to ask questions and all were answered. The patient agreed with the plan and demonstrated an understanding of the instructions.   The patient was advised to call back or seek an in-person evaluation if the symptoms worsen or if the condition fails to improve as anticipated.     Mackie Pai, PA-C  Review of Systems  Constitutional: Negative for activity change, chills, diaphoresis, fatigue and fever.  Respiratory: Negative for cough, chest tightness and shortness of breath.   Cardiovascular: Negative for chest pain, palpitations and leg swelling.  Gastrointestinal: Negative for  abdominal pain, nausea and vomiting.  Musculoskeletal: Negative for neck pain and neck stiffness.  Neurological: Negative for dizziness, tremors, numbness and headaches.  Psychiatric/Behavioral: Negative for agitation, behavioral problems and confusion. The patient is not nervous/anxious.        Objective:   Physical Exam  No acute distress.      Assessment & Plan:

## 2018-05-07 NOTE — Patient Instructions (Addendum)
Pt bp is well controlled today. Continue current treatment regimen.  For diabetes continue metformin and low sugar diet. Will get a1c soon.  For high cholesterol continue crestor and will get lipid panel.  Pt will future labs. To get labs done next week.(pt appt is 8:45 for Monday)  Follow up in approximate 3.5 months.

## 2018-05-08 ENCOUNTER — Other Ambulatory Visit: Payer: BC Managed Care – PPO

## 2018-05-14 ENCOUNTER — Other Ambulatory Visit (INDEPENDENT_AMBULATORY_CARE_PROVIDER_SITE_OTHER): Payer: BC Managed Care – PPO

## 2018-05-14 ENCOUNTER — Other Ambulatory Visit: Payer: Self-pay

## 2018-05-14 DIAGNOSIS — E785 Hyperlipidemia, unspecified: Secondary | ICD-10-CM

## 2018-05-14 DIAGNOSIS — I10 Essential (primary) hypertension: Secondary | ICD-10-CM

## 2018-05-14 DIAGNOSIS — E119 Type 2 diabetes mellitus without complications: Secondary | ICD-10-CM | POA: Diagnosis not present

## 2018-05-14 LAB — COMPREHENSIVE METABOLIC PANEL
ALT: 23 U/L (ref 0–35)
AST: 24 U/L (ref 0–37)
Albumin: 4.6 g/dL (ref 3.5–5.2)
Alkaline Phosphatase: 50 U/L (ref 39–117)
BUN: 13 mg/dL (ref 6–23)
CO2: 34 mEq/L — ABNORMAL HIGH (ref 19–32)
Calcium: 9.6 mg/dL (ref 8.4–10.5)
Chloride: 96 mEq/L (ref 96–112)
Creatinine, Ser: 0.87 mg/dL (ref 0.40–1.20)
GFR: 83.73 mL/min (ref >60.00)
Glucose, Bld: 99 mg/dL (ref 70–99)
Potassium: 3.6 mEq/L (ref 3.5–5.1)
Sodium: 139 mEq/L (ref 135–145)
Total Bilirubin: 0.4 mg/dL (ref 0.2–1.2)
Total Protein: 7.4 g/dL (ref 6.0–8.3)

## 2018-05-14 LAB — LIPID PANEL
Cholesterol: 141 mg/dL (ref 0–200)
HDL: 42.7 mg/dL (ref >39.00)
LDL Cholesterol: 75 mg/dL (ref 0–99)
NonHDL: 98.23
Total CHOL/HDL Ratio: 3
Triglycerides: 114 mg/dL (ref 0.0–149.0)
VLDL: 22.8 mg/dL (ref 0.0–40.0)

## 2018-05-14 LAB — HEMOGLOBIN A1C: Hgb A1c MFr Bld: 6.8 % — ABNORMAL HIGH (ref 4.6–6.5)

## 2018-05-17 ENCOUNTER — Telehealth: Payer: Self-pay

## 2018-05-17 NOTE — Telephone Encounter (Signed)
Copied from [place] [id number]. Topic: General - Inquiry >> May 17, 2018  3:12 PM Selinda Flavin B, [place] wrote: Reason for CRM: Patient calling to check the status of her lab results from 05/14/2018. Please advise.  CB#: [phone]

## 2018-05-17 NOTE — Telephone Encounter (Signed)
Called patient left message for  Return call regarding lab results.

## 2018-05-18 ENCOUNTER — Telehealth: Payer: Self-pay

## 2018-05-18 ENCOUNTER — Other Ambulatory Visit: Payer: Self-pay | Admitting: Medical

## 2018-05-18 NOTE — Telephone Encounter (Signed)
Called patient regarding lab results on yesterday. Patient returned call today. Given lab results and patient states she will come in 3 months for follow up.

## 2018-05-31 ENCOUNTER — Telehealth: Payer: Self-pay | Admitting: Medical

## 2018-05-31 NOTE — Telephone Encounter (Signed)
Pt does not have  Dx of diabetes insurance  will not cover meter and supplies

## 2018-05-31 NOTE — Telephone Encounter (Signed)
Copied from [place]. Topic: General - Other >> May 31, 2018  9:19 AM Christina Leblanc wrote: Reason for CRM: pt has questions regarding her diabetic supplies and where she need to get it from. Please call pt.

## 2018-06-01 MED ORDER — BLOOD GLUCOSE METER KIT: PACK | 0 refills | Status: AC

## 2018-06-01 NOTE — Telephone Encounter (Signed)
That dx is not on pt's problem list. I will send in meter.

## 2018-06-01 NOTE — Telephone Encounter (Signed)
I put diabetes dx on her last visit. Should be in her history. a1c above 6.5. So you can give pt  statndard advise about contacting her insurance to see if they will giver her supplies. If not what brand glucometer they prefer. We could rx meter and supplies if we get brand name they prefer etc.

## 2018-06-01 NOTE — Telephone Encounter (Signed)
Ok

## 2018-06-11 ENCOUNTER — Other Ambulatory Visit: Payer: Self-pay | Admitting: Medical

## 2018-06-12 ENCOUNTER — Other Ambulatory Visit: Payer: Self-pay | Admitting: Medical

## 2018-06-13 MED ORDER — ROSUVASTATIN CALCIUM 40 MG PO TABS: 40.0000 mg | ORAL_TABLET | Freq: Every day | ORAL | 1 refills | Status: DC

## 2018-06-13 NOTE — Addendum Note (Signed)
Addended by: Kelle Darting A on: 06/13/2018 03:56 PM   Modules accepted: Orders

## 2018-06-13 NOTE — Telephone Encounter (Signed)
Rx re-sent. Left detailed message on pt's voicemail to call if she has any further difficulties getting RX.

## 2018-06-13 NOTE — Telephone Encounter (Signed)
Pt called to f/u on rosuvastatin. Pharmacy said not received. I called pharmacy and was also advised they did not receive the RX. Please resend  [facility], [place] [phone] (Phone) [phone] (Fax)

## 2018-08-28 ENCOUNTER — Ambulatory Visit: Payer: BC Managed Care – PPO | Admitting: Medical

## 2018-08-28 ENCOUNTER — Encounter: Payer: Self-pay | Admitting: Medical

## 2018-08-28 ENCOUNTER — Other Ambulatory Visit: Payer: Self-pay

## 2018-08-28 VITALS — BP 134/79 | HR 79 | Temp 97.5°F | Resp 16 | Ht 67.0 in | Wt 215.8 lb

## 2018-08-28 DIAGNOSIS — I10 Essential (primary) hypertension: Secondary | ICD-10-CM | POA: Diagnosis not present

## 2018-08-28 DIAGNOSIS — E785 Hyperlipidemia, unspecified: Secondary | ICD-10-CM | POA: Diagnosis not present

## 2018-08-28 DIAGNOSIS — E119 Type 2 diabetes mellitus without complications: Secondary | ICD-10-CM

## 2018-08-28 LAB — COMPREHENSIVE METABOLIC PANEL
ALT: 18 U/L (ref 0–35)
AST: 19 U/L (ref 0–37)
Albumin: 4.4 g/dL (ref 3.5–5.2)
Alkaline Phosphatase: 51 U/L (ref 39–117)
BUN: 11 mg/dL (ref 6–23)
CO2: 32 mEq/L (ref 19–32)
Calcium: 9.5 mg/dL (ref 8.4–10.5)
Chloride: 101 mEq/L (ref 96–112)
Creatinine, Ser: 0.75 mg/dL (ref 0.40–1.20)
GFR: 99.25 mL/min (ref >60.00)
Glucose, Bld: 95 mg/dL (ref 70–99)
Potassium: 3.7 mEq/L (ref 3.5–5.1)
Sodium: 139 mEq/L (ref 135–145)
Total Bilirubin: 0.5 mg/dL (ref 0.2–1.2)
Total Protein: 7.6 g/dL (ref 6.0–8.3)

## 2018-08-28 LAB — LIPID PANEL
Cholesterol: 147 mg/dL (ref 0–200)
HDL: 48.5 mg/dL (ref >39.00)
LDL Cholesterol: 83 mg/dL (ref 0–99)
NonHDL: 98.03
Total CHOL/HDL Ratio: 3
Triglycerides: 76 mg/dL (ref 0.0–149.0)
VLDL: 15.2 mg/dL (ref 0.0–40.0)

## 2018-08-28 LAB — HEMOGLOBIN A1C: Hgb A1c MFr Bld: 6.4 % (ref 4.6–6.5)

## 2018-08-28 NOTE — Patient Instructions (Signed)
Your bp is well controlled at home. Continue current bp meds. Some component of white coat bp elevation.   For high cholesterol, I want you to continue crestor and will get cmp and lipid panel today.  For diabetes will get a1c today. If a1c closer to 6.5 can continue metformin 1000 mg 1/2 tab once daily.  Follow up date to be determined.

## 2018-08-28 NOTE — Progress Notes (Signed)
Subjective:    Patient ID: Christina Leblanc, female    DOB: 03/18/1970, 48 y.o.   MRN: [id number]  HPI Pt in states she has been getting very good bp reading at home since the pandemic. At home she can get 125/78. One time she even got 120/78. Pt states feels well.  Pt seems to always have higher bp here. She states seems to get nervous when she arrives to office.  Pt is diabetic. Pt states she can tolerate 1/2 tab of metformin with no side effect. With whole tablet she states can't tolerate due to gi side effects. Pt has glucometer but has not been checking her sugars. Only checked sugars twice during the pandemic.  Pt has high cholesterol and she is on crestor. No adverse side effects reported.    Review of Systems  Constitutional: Negative for chills, fatigue and fever.  HENT: Negative for congestion, ear discharge, hearing loss, nosebleeds, sinus pressure, sinus pain and tinnitus.   Respiratory: Negative for cough, chest tightness, shortness of breath and wheezing.   Cardiovascular: Negative for chest pain and palpitations.  Gastrointestinal: Negative for abdominal pain, diarrhea and nausea.  Endocrine: Positive for polydipsia. Negative for polyphagia and polyuria.       Thirst is not constant. Just comes and goes. She has been walking more.  Genitourinary: Negative for decreased urine volume, dysuria, hematuria, menstrual problem and vaginal pain.  Musculoskeletal: Negative for back pain and gait problem.  Skin: Negative for rash.  Neurological: Negative for dizziness, light-headedness and headaches.  Hematological: Negative for adenopathy. Does not bruise/bleed easily.  Psychiatric/Behavioral: Negative for behavioral problems, confusion, hallucinations and sleep disturbance. The patient is not nervous/anxious.     Past Medical History:  Diagnosis Date  . Allergy   . Hyperlipidemia   . Hypertension      Social History   Socioeconomic History  . Marital status: Divorced     Spouse name: Not on file  . Number of children: Not on file  . Years of education: Not on file  . Highest education level: Not on file  Occupational History  . Not on file  Social Needs  . Financial resource strain: Not on file  . Food insecurity    Worry: Not on file    Inability: Not on file  . Transportation needs    Medical: Not on file    Non-medical: Not on file  Tobacco Use  . Smoking status: Never Smoker  . Smokeless tobacco: Never Used  Substance and Sexual Activity  . Alcohol use: Yes    Alcohol/week: 0.0 standard drinks    Comment: wine occasionally .Twice.  . Drug use: No  . Sexual activity: Not on file  Lifestyle  . Physical activity    Days per week: Not on file    Minutes per session: Not on file  . Stress: Not on file  Relationships  . Social Herbalist on phone: Not on file    Gets together: Not on file    Attends religious service: Not on file    Active member of club or organization: Not on file    Attends meetings of clubs or organizations: Not on file    Relationship status: Not on file  . Intimate partner violence    Fear of current or ex partner: Not on file    Emotionally abused: Not on file    Physically abused: Not on file    Forced sexual activity: Not on file  Other Topics Concern  . Not on file  Social History Narrative  . Not on file    Past Surgical History:  Procedure Laterality Date  . ABDOMINAL HYSTERECTOMY     2003    Family History  Problem Relation Age of Onset  . Diabetes Father   . Heart disease Father   . Diabetes Mother   . Cancer Brother   . Hypertension Brother     Allergies  Allergen Reactions  . Claritin [Loratadine] Palpitations    Current Outpatient Medications on File Prior to Visit  Medication Sig Dispense Refill  . acetaminophen (TYLENOL) 500 MG tablet Take 500 mg by mouth every 6 (six) hours as needed.    Marland Kitchen amLODipine (NORVASC) 10 MG tablet Take 1 tablet (10 mg total) by mouth daily.  30 tablet 0  . amLODipine (NORVASC) 5 MG tablet Take 1 tablet (5 mg total) by mouth daily. 90 tablet 1  . azelastine (ASTELIN) 0.1 % nasal spray Place 2 sprays into both nostrils at bedtime as needed for rhinitis. Use in each nostril as directed 30 mL 3  . azithromycin (ZITHROMAX) 250 MG tablet Take 2 tablets by mouth on day 1, followed by 1 tablet by mouth daily for 4 days. 6 tablet 0  . benzonatate (TESSALON) 100 MG capsule Take 1 capsule (100 mg total) by mouth 3 (three) times daily as needed for cough. 30 capsule 0  . blood glucose meter kit and supplies Check blood sugar daily  (E11.9). 1 each 0  . chlorthalidone (HYGROTON) 25 MG tablet Take 1 tablet (25 mg total) by mouth daily. 90 tablet 1  . cyclobenzaprine (FLEXERIL) 5 MG tablet Take 1 tablet (5 mg total) by mouth at bedtime. 10 tablet 0  . doxycycline (VIBRA-TABS) 100 MG tablet Take 1 tablet (100 mg total) by mouth 2 (two) times daily. Can give caps or generic. 14 tablet 0  . fluticasone (FLONASE) 50 MCG/ACT nasal spray Place 2 sprays into both nostrils daily. 16 g 1  . HYDROcodone-homatropine (HYCODAN) 5-1.5 MG/5ML syrup Take 5 mLs by mouth at bedtime as needed. 100 mL 0  . levocetirizine (XYZAL) 5 MG tablet Take 1 tablet (5 mg total) by mouth every evening. 90 tablet 3  . meloxicam (MOBIC) 7.5 MG tablet Take 1 tablet (7.5 mg total) by mouth daily. 30 tablet 0  . metFORMIN (GLUCOPHAGE) 1000 MG tablet Take 1 tablet (1,000 mg total) by mouth 2 (two) times daily with a meal. 180 tablet 3  . NON FORMULARY     . potassium chloride SA (K-DUR) 20 MEQ tablet Take 1 tablet (20 mEq total) by mouth daily. 90 tablet 1  . rosuvastatin (CRESTOR) 40 MG tablet Take 1 tablet (40 mg total) by mouth daily. 90 tablet 1   No current facility-administered medications on file prior to visit.     BP (!) 152/78   Pulse 79   Temp (!) 97.5 F (36.4 C) (Oral)   Resp 16   Ht '5\' 7"'$  (1.702 m)   Wt 215 lb 12.8 oz (97.9 kg)   SpO2 99%   BMI 33.80 kg/m        Objective:   Physical Exam  General Mental Status- Alert. General Appearance- Not in acute distress.   Skin General: Color- Normal Color. Moisture- Normal Moisture.  Neck Carotid Arteries- Normal color. Moisture- Normal Moisture. No carotid bruits. No JVD.  Chest and Lung Exam Auscultation: Breath Sounds:-Normal.  Cardiovascular Auscultation:Rythm- Regular. Murmurs & Other Heart Sounds:Auscultation of the  heart reveals- No Murmurs.  Abdomen Inspection:-Inspeection Normal. Palpation/Percussion:Note:No mass. Palpation and Percussion of the abdomen reveal- Non Tender, Non Distended + BS, no rebound or guarding.   Neurologic Cranial Nerve exam:- CN III-XII intact(No nystagmus), symmetric smile. Strength:- 5/5 equal and symmetric strength both upper and lower extremities.      Assessment & Plan:  Your bp is well controlled at home. Continue current bp meds. Some component of white coat bp elevation.   For high cholesterol, I want you to continue crestor and will get cmp and lipid panel today.  For diabetes will get a1c today. If a1c closer to 6.5 can continue metformin 1000 mg 1/2 tab once daily.  Follow up date to be determined.  25 minutes spent with pt. 50% of time counseling pt on dx and tx. In addition educating pt on flu vaccine this year in light of covid pandemic.  Mackie Pai, PA-C

## 2018-08-29 LAB — MICROALBUMIN, URINE: Microalb, Ur: <0.2 mg/dL

## 2018-09-05 ENCOUNTER — Ambulatory Visit: Payer: BC Managed Care – PPO | Admitting: Medical

## 2018-11-17 ENCOUNTER — Other Ambulatory Visit: Payer: Self-pay | Admitting: Medical

## 2018-11-20 ENCOUNTER — Other Ambulatory Visit: Payer: Self-pay | Admitting: Medical

## 2018-11-20 MED ORDER — AMLODIPINE BESYLATE 5 MG PO TABS: 5.0000 mg | ORAL_TABLET | Freq: Every day | ORAL | 0 refills | Status: DC

## 2018-11-20 NOTE — Telephone Encounter (Signed)
Copied from [place] [id number]. Topic: Quick Communication - Rx Refill/Question >> Nov 20, 2018  9:06 AM Rainey Pines A wrote: Medication: amLODipine (NORVASC) 5 MG tablet (Patient stated that pharmacy needs medication sent back over)  Has the patient contacted their pharmacy? {Yes (Agent: If no, request that the patient contact the pharmacy for the refill.) (Agent: If yes, when and what did the pharmacy advise?)Contact PCP  Preferred Pharmacy (with phone number or street name): [facility] [phone] (Phone) [phone] (Fax)    Agent: Please be advised that RX refills may take up to 3 business days. We ask that you follow-up with your pharmacy.

## 2018-11-22 ENCOUNTER — Encounter: Payer: Self-pay | Admitting: Medical

## 2018-11-22 ENCOUNTER — Other Ambulatory Visit: Payer: Self-pay

## 2018-11-22 ENCOUNTER — Ambulatory Visit (HOSPITAL_BASED_OUTPATIENT_CLINIC_OR_DEPARTMENT_OTHER)
Admission: RE | Admit: 2018-11-22 | Discharge: 2018-11-22 | Disposition: A | Discharge status: Home / Self Care | Payer: BC Managed Care – PPO | Source: Ambulatory Visit | Attending: Medical | Admitting: Medical

## 2018-11-22 ENCOUNTER — Ambulatory Visit: Payer: BC Managed Care – PPO | Admitting: Medical

## 2018-11-22 VITALS — BP 161/73 | HR 89 | Temp 96.2°F | Resp 16 | Ht 67.0 in | Wt 220.4 lb

## 2018-11-22 DIAGNOSIS — F419 Anxiety disorder, unspecified: Secondary | ICD-10-CM

## 2018-11-22 DIAGNOSIS — S46811A Strain of other muscles, fascia and tendons at shoulder and upper arm level, right arm, initial encounter: Secondary | ICD-10-CM | POA: Diagnosis not present

## 2018-11-22 DIAGNOSIS — M25511 Pain in right shoulder: Secondary | ICD-10-CM

## 2018-11-22 DIAGNOSIS — G8929 Other chronic pain: Secondary | ICD-10-CM

## 2018-11-22 DIAGNOSIS — R0789 Other chest pain: Secondary | ICD-10-CM | POA: Diagnosis present

## 2018-11-22 MED ORDER — MELOXICAM 7.5 MG PO TABS: 7.5000 mg | ORAL_TABLET | Freq: Every day | ORAL | 0 refills | Status: DC

## 2018-11-22 NOTE — Progress Notes (Signed)
Subjective:    Patient ID: Christina Leblanc, female    DOB: November 17, 1970, 49 y.o.   MRN: [id number]  HPI  Pt in for some recent rt shoulder pain. Pt states sometimes when stressed shoulder pain feels little worse. Pain is worse at night. This has been going off and on since around June.  Pain will come and go intermittently. Sometimes when move rt upper ext and rt shoulder. Some pain rt trap and rt posterior shoulder when has some pain rt upper chest beneath clavicle. No pain on deep breathing.  Pt never takes anything for pain. Pt states pain at most is level 6/10.  No mid chest pain. No cardiac type signs or symptoms. No pain on breathing.  No mid c spine pain. No radicular pain.   No rash to skin in this area.  Pain about twice a week.  Review of Systems  Constitutional: Negative for chills, fatigue and fever.  HENT: Negative for congestion and ear pain.   Respiratory: Negative for cough, chest tightness, shortness of breath and wheezing.   Cardiovascular: Negative for chest pain and palpitations.  Gastrointestinal: Negative for abdominal pain, constipation, diarrhea, rectal pain and vomiting.  Musculoskeletal: Negative for gait problem and neck stiffness.       See hpi.  Skin: Negative for rash.  Neurological: Negative for dizziness, speech difficulty, weakness and light-headedness.  Hematological: Negative for adenopathy. Does not bruise/bleed easily.  Psychiatric/Behavioral: Negative for agitation, decreased concentration, dysphoric mood and suicidal ideas. The patient is nervous/anxious.        Stress sometimes will effect rt shoulder area pain. 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    Past Medical History:  Diagnosis Date  . Allergy   . Hyperlipidemia   . Hypertension      Social History   Socioeconomic History  . Marital status: Divorced    Spouse name: Not on file  . Number of children: Not on file   . Years of education: Not on file  . Highest education level: Not on file  Occupational History  . Not on file  Social Needs  . Financial resource strain: Not on file  . Food insecurity    Worry: Not on file    Inability: Not on file  . Transportation needs    Medical: Not on file    Non-medical: Not on file  Tobacco Use  . Smoking status: Never Smoker  . Smokeless tobacco: Never Used  Substance and Sexual Activity  . Alcohol use: Yes    Alcohol/week: 0.0 standard drinks    Comment: wine occasionally .Twice.  . Drug use: No  . Sexual activity: Not on file  Lifestyle  . Physical activity    Days per week: Not on file    Minutes per session: Not on file  . Stress: Not on file  Relationships  . Social Herbalist on phone: Not on file    Gets together: Not on file    Attends religious service: Not on file    Active member of club or organization: Not on file    Attends meetings of clubs or organizations: Not on file    Relationship status: Not on file  . Intimate partner violence    Fear of current or ex partner: Not on file    Emotionally abused: Not on file    Physically abused: Not on file    Forced sexual activity: Not  on file  Other Topics Concern  . Not on file  Social History Narrative  . Not on file    Past Surgical History:  Procedure Laterality Date  . ABDOMINAL HYSTERECTOMY     2003    Family History  Problem Relation Age of Onset  . Diabetes Father   . Heart disease Father   . Diabetes Mother   . Cancer Brother   . Hypertension Brother     Allergies  Allergen Reactions  . Claritin [Loratadine] Palpitations    Current Outpatient Medications on File Prior to Visit  Medication Sig Dispense Refill  . acetaminophen (TYLENOL) 500 MG tablet Take 500 mg by mouth every 6 (six) hours as needed.    Marland Kitchen amLODipine (NORVASC) 10 MG tablet Take 1 tablet (10 mg total) by mouth daily. 30 tablet 0  . amLODipine (NORVASC) 5 MG tablet Take 1 tablet (5  mg total) by mouth daily. 90 tablet 0  . azelastine (ASTELIN) 0.1 % nasal spray Place 2 sprays into both nostrils at bedtime as needed for rhinitis. Use in each nostril as directed 30 mL 3  . azithromycin (ZITHROMAX) 250 MG tablet Take 2 tablets by mouth on day 1, followed by 1 tablet by mouth daily for 4 days. 6 tablet 0  . benzonatate (TESSALON) 100 MG capsule Take 1 capsule (100 mg total) by mouth 3 (three) times daily as needed for cough. 30 capsule 0  . blood glucose meter kit and supplies Check blood sugar daily  (E11.9). 1 each 0  . chlorthalidone (HYGROTON) 25 MG tablet Take 1 tablet (25 mg total) by mouth daily. 90 tablet 1  . cyclobenzaprine (FLEXERIL) 5 MG tablet Take 1 tablet (5 mg total) by mouth at bedtime. 10 tablet 0  . doxycycline (VIBRA-TABS) 100 MG tablet Take 1 tablet (100 mg total) by mouth 2 (two) times daily. Can give caps or generic. 14 tablet 0  . fluticasone (FLONASE) 50 MCG/ACT nasal spray Place 2 sprays into both nostrils daily. 16 g 1  . HYDROcodone-homatropine (HYCODAN) 5-1.5 MG/5ML syrup Take 5 mLs by mouth at bedtime as needed. 100 mL 0  . levocetirizine (XYZAL) 5 MG tablet Take 1 tablet (5 mg total) by mouth every evening. 90 tablet 3  . metFORMIN (GLUCOPHAGE) 1000 MG tablet Take 1 tablet (1,000 mg total) by mouth 2 (two) times daily with a meal. 180 tablet 3  . NON FORMULARY     . potassium chloride SA (K-DUR) 20 MEQ tablet Take 1 tablet (20 mEq total) by mouth daily. 90 tablet 1  . rosuvastatin (CRESTOR) 40 MG tablet Take 1 tablet (40 mg total) by mouth daily. 90 tablet 1   No current facility-administered medications on file prior to visit.     BP (!) 161/73   Pulse 89   Temp (!) 96.2 F (35.7 C) (Temporal)   Resp 16   Ht 5' 7" (1.702 m)   Wt 220 lb 6.4 oz (100 kg)   SpO2 99%   BMI 34.52 kg/m       Objective:   Physical Exam  General Mental Status- Alert. General Appearance- Not in acute distress.   Skin General: Color- Normal Color.  Moisture- Normal Moisture.  Neck Carotid Arteries- Normal color. Moisture- Normal Moisture. No carotid bruits. No JVD. No mid c spine tender. Rt side trapezius mild tender.  Chest and Lung Exam Auscultation: Breath Sounds:-Normal.  Cardiovascular Auscultation:Rythm- Regular. Murmurs & Other Heart Sounds:Auscultation of the heart reveals- No Murmurs.  Abdomen  Inspection:-Inspeection Normal. Palpation/Percussion:Note:No mass. Palpation and Percussion of the abdomen reveal- Non Tender, Non Distended + BS, no rebound or guarding.   Neurologic Cranial Nerve exam:- CN III-XII intact(No nystagmus), symmetric smile. Strength:- 5/5 equal and symmetric strength both upper and lower extremities.  Anterior thorax- rt side upper chest not tender presently under clavicle.      Assessment & Plan:  You do have rt shoulder, rt trapezius and rt upper pectoralis area pain. Will rx meloxicam, get rt shoudler xray and cxr. Ekg showed normal sinus rhythm.  We did ekg do to some atypical features to rt upper chest/pectoral region pain. If pain worsens/changes then ED evaluation. Particularly any mid chest, left sided or respiratory symptoms.  Some stress and anxiety related to work and life in general. Want you to consider using medication call buspar.  Follow up in 10 days or as needed  [employer], PA-C   25 + minutes spent with pt. 50% of time spent counseling pt on plan going forward.

## 2018-11-22 NOTE — Patient Instructions (Addendum)
You do have rt shoulder, rt trapezius and rt upper pectoralis area pain. Will rx meloxicam, get rt shoudler xray and cxr.  We did ekg do to some atypical features to rt upper chest/pectoral region pain. If pain worsens/changes then ED evaluation. Particularly any mid chest, left sided or respiratory symptoms. Ekg showed normal sinus rhythm.(some artifact. V4, v5 anc v6 normal. But on far right portion of ekg looked like pvc) Some technical difficulty getting ekg today)  Some stress and anxiety related to work and life in general. Want you to consider using medication call buspar.  Follow up in 10 days or as needed

## 2018-12-06 ENCOUNTER — Encounter: Payer: Self-pay | Admitting: Medical

## 2018-12-06 ENCOUNTER — Ambulatory Visit: Payer: BC Managed Care – PPO | Admitting: Medical

## 2018-12-06 ENCOUNTER — Other Ambulatory Visit: Payer: Self-pay

## 2018-12-06 VITALS — BP 140/90 | HR 90 | Temp 96.6°F | Resp 16 | Ht 67.0 in | Wt 216.4 lb

## 2018-12-06 DIAGNOSIS — E119 Type 2 diabetes mellitus without complications: Secondary | ICD-10-CM | POA: Diagnosis not present

## 2018-12-06 DIAGNOSIS — I10 Essential (primary) hypertension: Secondary | ICD-10-CM

## 2018-12-06 DIAGNOSIS — E785 Hyperlipidemia, unspecified: Secondary | ICD-10-CM | POA: Diagnosis not present

## 2018-12-06 LAB — COMPREHENSIVE METABOLIC PANEL
ALT: 18 U/L (ref 0–35)
AST: 20 U/L (ref 0–37)
Albumin: 4.4 g/dL (ref 3.5–5.2)
Alkaline Phosphatase: 58 U/L (ref 39–117)
BUN: 8 mg/dL (ref 6–23)
CO2: 37 mEq/L — ABNORMAL HIGH (ref 19–32)
Calcium: 9.7 mg/dL (ref 8.4–10.5)
Chloride: 94 mEq/L — ABNORMAL LOW (ref 96–112)
Creatinine, Ser: 0.87 mg/dL (ref 0.40–1.20)
GFR: 83.53 mL/min (ref >60.00)
Glucose, Bld: 106 mg/dL — ABNORMAL HIGH (ref 70–99)
Potassium: 3.4 mEq/L — ABNORMAL LOW (ref 3.5–5.1)
Sodium: 139 mEq/L (ref 135–145)
Total Bilirubin: 0.6 mg/dL (ref 0.2–1.2)
Total Protein: 7.4 g/dL (ref 6.0–8.3)

## 2018-12-06 LAB — LIPID PANEL
Cholesterol: 143 mg/dL (ref 0–200)
HDL: 44.1 mg/dL (ref >39.00)
LDL Cholesterol: 82 mg/dL (ref 0–99)
NonHDL: 98.93
Total CHOL/HDL Ratio: 3
Triglycerides: 83 mg/dL (ref 0.0–149.0)
VLDL: 16.6 mg/dL (ref 0.0–40.0)

## 2018-12-06 LAB — HEMOGLOBIN A1C: Hgb A1c MFr Bld: 6.7 % — ABNORMAL HIGH (ref 4.6–6.5)

## 2018-12-06 NOTE — Patient Instructions (Signed)
Your bp is little high today but just started back on amlodipine. In about one week expect med to take full effect. Check bp and want level to be about 130/80.  For diabetes, continue metformin and get a1c today.  For high cholesterol, continue current statin and get cmp with lipid panel today.  Follow up 3 months or as needed

## 2018-12-06 NOTE — Progress Notes (Signed)
Subjective:    Patient ID: Christina Leblanc, female    DOB: December 10, 1970, 48 y.o.   MRN: [id number]  HPI  Pt in for follow up.   She has hx of htn, high cholesterol and diabetes.  htn- pt is on chorthalidone. She ran out of norvasc early. No sure why since she had active rx. Just restarted that yesterday.  Pt has not been checking sugars recently. Will get a1c today. Pt thinks for most part successful with low sugar diet. But some slight increase recently.  For high cholesterol she is on crestor.   Former rt shoulder pain that I saw her for about 2 weeks ago is now gone. Went away within about 2 days.  Pt declined flu vaccine today but she knows can call back if changes mind.   Review of Systems  Constitutional: Negative for chills, fatigue and fever.  Respiratory: Negative for cough, chest tightness, shortness of breath and wheezing.   Cardiovascular: Negative for chest pain and palpitations.  Gastrointestinal: Negative for abdominal pain and blood in stool.  Genitourinary: Negative for dysuria, flank pain and urgency.  Musculoskeletal: Negative for back pain, myalgias and neck stiffness.  Skin: Negative for rash.  Neurological: Negative for dizziness, speech difficulty, weakness and headaches.  Hematological: Negative for adenopathy. Does not bruise/bleed easily.  Psychiatric/Behavioral: Negative for behavioral problems, confusion and hallucinations. The patient is not nervous/anxious.     Past Medical History:  Diagnosis Date  . Allergy   . Hyperlipidemia   . Hypertension      Social History   Socioeconomic History  . Marital status: Divorced    Spouse name: Not on file  . Number of children: Not on file  . Years of education: Not on file  . Highest education level: Not on file  Occupational History  . Not on file  Social Needs  . Financial resource strain: Not on file  . Food insecurity    Worry: Not on file    Inability: Not on file  . Transportation needs     Medical: Not on file    Non-medical: Not on file  Tobacco Use  . Smoking status: Never Smoker  . Smokeless tobacco: Never Used  Substance and Sexual Activity  . Alcohol use: Yes    Alcohol/week: 0.0 standard drinks    Comment: wine occasionally .Twice.  . Drug use: No  . Sexual activity: Not on file  Lifestyle  . Physical activity    Days per week: Not on file    Minutes per session: Not on file  . Stress: Not on file  Relationships  . Social Herbalist on phone: Not on file    Gets together: Not on file    Attends religious service: Not on file    Active member of club or organization: Not on file    Attends meetings of clubs or organizations: Not on file    Relationship status: Not on file  . Intimate partner violence    Fear of current or ex partner: Not on file    Emotionally abused: Not on file    Physically abused: Not on file    Forced sexual activity: Not on file  Other Topics Concern  . Not on file  Social History Narrative  . Not on file    Past Surgical History:  Procedure Laterality Date  . ABDOMINAL HYSTERECTOMY     2003    Family History  Problem Relation Age of Onset  .  Diabetes Father   . Heart disease Father   . Diabetes Mother   . Cancer Brother   . Hypertension Brother     Allergies  Allergen Reactions  . Claritin [Loratadine] Palpitations    Current Outpatient Medications on File Prior to Visit  Medication Sig Dispense Refill  . acetaminophen (TYLENOL) 500 MG tablet Take 500 mg by mouth every 6 (six) hours as needed.    Marland Kitchen amLODipine (NORVASC) 10 MG tablet Take 1 tablet (10 mg total) by mouth daily. 30 tablet 0  . amLODipine (NORVASC) 5 MG tablet Take 1 tablet (5 mg total) by mouth daily. 90 tablet 0  . azelastine (ASTELIN) 0.1 % nasal spray Place 2 sprays into both nostrils at bedtime as needed for rhinitis. Use in each nostril as directed 30 mL 3  . azithromycin (ZITHROMAX) 250 MG tablet Take 2 tablets by mouth on day 1,  followed by 1 tablet by mouth daily for 4 days. 6 tablet 0  . benzonatate (TESSALON) 100 MG capsule Take 1 capsule (100 mg total) by mouth 3 (three) times daily as needed for cough. 30 capsule 0  . blood glucose meter kit and supplies Check blood sugar daily  (E11.9). 1 each 0  . chlorthalidone (HYGROTON) 25 MG tablet Take 1 tablet (25 mg total) by mouth daily. 90 tablet 1  . cyclobenzaprine (FLEXERIL) 5 MG tablet Take 1 tablet (5 mg total) by mouth at bedtime. 10 tablet 0  . doxycycline (VIBRA-TABS) 100 MG tablet Take 1 tablet (100 mg total) by mouth 2 (two) times daily. Can give caps or generic. 14 tablet 0  . fluticasone (FLONASE) 50 MCG/ACT nasal spray Place 2 sprays into both nostrils daily. 16 g 1  . HYDROcodone-homatropine (HYCODAN) 5-1.5 MG/5ML syrup Take 5 mLs by mouth at bedtime as needed. 100 mL 0  . levocetirizine (XYZAL) 5 MG tablet Take 1 tablet (5 mg total) by mouth every evening. 90 tablet 3  . meloxicam (MOBIC) 7.5 MG tablet Take 1 tablet (7.5 mg total) by mouth daily. 14 tablet 0  . metFORMIN (GLUCOPHAGE) 1000 MG tablet Take 1 tablet (1,000 mg total) by mouth 2 (two) times daily with a meal. 180 tablet 3  . NON FORMULARY     . potassium chloride SA (K-DUR) 20 MEQ tablet Take 1 tablet (20 mEq total) by mouth daily. 90 tablet 1  . rosuvastatin (CRESTOR) 40 MG tablet Take 1 tablet (40 mg total) by mouth daily. 90 tablet 1   No current facility-administered medications on file prior to visit.     BP (!) 153/83   Pulse 90   Temp (!) 96.6 F (35.9 C) (Temporal)   Resp 16   Ht 5' 7" (1.702 m)   Wt 216 lb 6.4 oz (98.2 kg)   SpO2 98%   BMI 33.89 kg/m       Objective:   Physical Exam  General Mental Status- Alert. General Appearance- Not in acute distress.   Skin General: Color- Normal Color. Moisture- Normal Moisture.  Neck Carotid Arteries- Normal color. Moisture- Normal Moisture. No carotid bruits. No JVD.  Chest and Lung Exam Auscultation: Breath  Sounds:-Normal.  Cardiovascular Auscultation:Rythm- Regular. Murmurs & Other Heart Sounds:Auscultation of the heart reveals- No Murmurs.  Abdomen Inspection:-Inspeection Normal. Palpation/Percussion:Note:No mass. Palpation and Percussion of the abdomen reveal- Non Tender, Non Distended + BS, no rebound or guarding.   Neurologic Cranial Nerve exam:- CN III-XII intact(No nystagmus), symmetric smile. Strength:- 5/5 equal and symmetric strength both upper and lower  extremities.      Assessment & Plan:  Your bp is little high today but just started back on amlodipine. In about one week expect med to take full effect. Check bp and want level to be about 130/80.  For diabetes, continue metformin and get a1c today.  For high cholesterol, continue current statin and get cmp with lipid panel today.  Follow up 3 months or as needed  25 minutes spent with pt. 50% of time spent counseling pt on plan going forward.  Mackie Pai, PA-C

## 2018-12-18 ENCOUNTER — Telehealth: Payer: Self-pay | Admitting: Medical

## 2018-12-18 NOTE — Telephone Encounter (Signed)
Just an FYI

## 2018-12-18 NOTE — Telephone Encounter (Signed)
Noted. Let pt know can see her virtually if gets worse or has concerns.

## 2018-12-18 NOTE — Telephone Encounter (Signed)
Pt called in to make provider aware that she tested positive for covid. Pt says that she doesn't need anything from provider. She has been advised by the health dept.

## 2018-12-19 NOTE — Telephone Encounter (Signed)
You sent message back to me. Looks like [facility] routed it back to you?

## 2018-12-26 ENCOUNTER — Other Ambulatory Visit: Payer: Self-pay | Admitting: Internal Medicine

## 2019-01-27 ENCOUNTER — Other Ambulatory Visit: Payer: Self-pay | Admitting: Medical

## 2019-01-28 NOTE — Telephone Encounter (Signed)
Pt was advised by pharmacy to call PCP for refill on rosuvastatin (CRESTOR) 40 MG tablet  / please advise

## 2019-04-28 ENCOUNTER — Other Ambulatory Visit: Payer: Self-pay | Admitting: Medical

## 2019-05-13 ENCOUNTER — Other Ambulatory Visit: Payer: Self-pay

## 2019-05-14 ENCOUNTER — Other Ambulatory Visit: Payer: Self-pay

## 2019-05-14 ENCOUNTER — Encounter: Payer: Self-pay | Admitting: Medical

## 2019-05-14 ENCOUNTER — Ambulatory Visit: Payer: BC Managed Care – PPO | Admitting: Medical

## 2019-05-14 VITALS — BP 140/83 | HR 95 | Temp 98.2°F | Resp 18 | Ht 67.0 in | Wt 205.8 lb

## 2019-05-14 DIAGNOSIS — R82998 Other abnormal findings in urine: Secondary | ICD-10-CM

## 2019-05-14 DIAGNOSIS — R809 Proteinuria, unspecified: Secondary | ICD-10-CM

## 2019-05-14 DIAGNOSIS — R350 Frequency of micturition: Secondary | ICD-10-CM | POA: Diagnosis not present

## 2019-05-14 DIAGNOSIS — E119 Type 2 diabetes mellitus without complications: Secondary | ICD-10-CM | POA: Diagnosis not present

## 2019-05-14 DIAGNOSIS — I10 Essential (primary) hypertension: Secondary | ICD-10-CM

## 2019-05-14 LAB — POC URINALSYSI DIPSTICK (AUTOMATED)
Bilirubin, UA: NEGATIVE
Blood, UA: POSITIVE
Glucose, UA: NEGATIVE
Ketones, UA: NEGATIVE
Leukocytes, UA: NEGATIVE
Nitrite, UA: NEGATIVE
Protein, UA: POSITIVE — AB
Spec Grav, UA: 1.01 (ref 1.010–1.025)
Urobilinogen, UA: 0.2 E.U./dL
pH, UA: 6.5 (ref 5.0–8.0)

## 2019-05-14 LAB — COMPREHENSIVE METABOLIC PANEL
ALT: 20 U/L (ref 0–35)
AST: 22 U/L (ref 0–37)
Albumin: 4.6 g/dL (ref 3.5–5.2)
Alkaline Phosphatase: 56 U/L (ref 39–117)
BUN: 14 mg/dL (ref 6–23)
CO2: 40 mEq/L — ABNORMAL HIGH (ref 19–32)
Calcium: 10.3 mg/dL (ref 8.4–10.5)
Chloride: 93 mEq/L — ABNORMAL LOW (ref 96–112)
Creatinine, Ser: 0.99 mg/dL (ref 0.40–1.20)
GFR: 71.83 mL/min (ref >60.00)
Glucose, Bld: 104 mg/dL — ABNORMAL HIGH (ref 70–99)
Potassium: 3.5 mEq/L (ref 3.5–5.1)
Sodium: 139 mEq/L (ref 135–145)
Total Bilirubin: 0.6 mg/dL (ref 0.2–1.2)
Total Protein: 7.6 g/dL (ref 6.0–8.3)

## 2019-05-14 LAB — HEMOGLOBIN A1C: Hgb A1c MFr Bld: 6.6 % — ABNORMAL HIGH (ref 4.6–6.5)

## 2019-05-14 MED ORDER — METOPROLOL SUCCINATE ER 25 MG PO TB24: 25.0000 mg | ORAL_TABLET | Freq: Every day | ORAL | 3 refills | Status: DC

## 2019-05-14 NOTE — Addendum Note (Signed)
Addended by: Anabel Halon on: 05/14/2019 09:23 AM   Modules accepted: Orders

## 2019-05-14 NOTE — Progress Notes (Addendum)
   Subjective:    Patient ID: Christina Leblanc, female    DOB: 1970/03/13, 49 y.o.   MRN: [id number]  HPI  Pt in stating recently had some bubble in her urine intermittently. This has been going on for over a month. Does not happen every time uses bathroom and never every day. Pt has no pain on urination, no fever, no chills, no suprapubic pain and no cva pain.  Pt has been on metformin for her diabetes.  Pt has htn. In past had cough with ARB.    Review of Systems See hpi.    Objective:   Physical Exam  General- No acute distress. Pleasant patient. Neck- Full range of motion, no jvd Lungs- Clear, even and unlabored. Heart- regular rate and rhythm. Neurologic- CNII- XII grossly intact. Back- no cva tenderness. Abdomen- soft, nt, nd, +bs, no rebound or guarding and not organomegaly.       Assessment & Plan:  You have history of diabetes and your sugars have been close to a1c of 6.5. Will get urine culture for frequent urination and since you have protein and urine dip will get urine microalbumin level.  Stay well hydrated, eat low sugar diet and continue metformin.  Will get a1c today with cmp.  Follow up date to be determined after lab result review or as needed.  For htn will add low dose metoprolol to your regimen. Unfortunate but dry cough with ARB on review/discussion so rx b blocker. While on b blocker note any pulses less than 60 and notify us.  Time spent with patient today was 25  minutes which consisted of chart review, discussing diagnosis, work up, treatment and documentation.  Mackie Pai, PA-C

## 2019-05-14 NOTE — Patient Instructions (Addendum)
You have history of diabetes and your sugars have been close to a1c of 6.5. Will get urine culture for frequent urination and since you have protein and urine dip will get urine microalbumin level.  Stay well hydrated, eat low sugar diet and continue metformin.  Will get a1c today with cmp.  For htn will add low dose metoprolol to your regimen. Unfortunate but dry cough with ARB on review/discussion so rx b blocker. While on b blocker note any pulses less than 60 and notify us.  Follow up date to be determined after lab result review or as needed.

## 2019-05-14 NOTE — Addendum Note (Signed)
Addended by: Anabel Halon on: 05/14/2019 09:22 AM   Modules accepted: Orders

## 2019-05-15 LAB — MICROALBUMIN, URINE: Microalb, Ur: 5.9 mg/dL

## 2019-05-15 LAB — URINE CULTURE
MICRO NUMBER:: 10410513
Result:: NO GROWTH
SPECIMEN QUALITY:: ADEQUATE

## 2019-06-12 ENCOUNTER — Other Ambulatory Visit: Payer: Self-pay | Admitting: Medical

## 2019-07-06 ENCOUNTER — Other Ambulatory Visit: Payer: Self-pay | Admitting: Medical

## 2019-07-26 ENCOUNTER — Other Ambulatory Visit: Payer: Self-pay | Admitting: Medical

## 2019-07-29 ENCOUNTER — Other Ambulatory Visit: Payer: Self-pay | Admitting: Medical

## 2019-08-03 ENCOUNTER — Other Ambulatory Visit: Payer: Self-pay | Admitting: Medical

## 2019-09-10 ENCOUNTER — Other Ambulatory Visit: Payer: Self-pay | Admitting: Medical

## 2019-10-11 ENCOUNTER — Other Ambulatory Visit: Payer: Self-pay | Admitting: Medical

## 2019-10-20 ENCOUNTER — Other Ambulatory Visit: Payer: Self-pay | Admitting: Medical

## 2019-10-22 ENCOUNTER — Ambulatory Visit (INDEPENDENT_AMBULATORY_CARE_PROVIDER_SITE_OTHER): Payer: BC Managed Care – PPO | Admitting: Medical

## 2019-10-22 ENCOUNTER — Other Ambulatory Visit: Payer: Self-pay

## 2019-10-22 ENCOUNTER — Encounter: Payer: Self-pay | Admitting: Medical

## 2019-10-22 VITALS — BP 140/80 | HR 79 | Temp 98.2°F | Resp 18 | Ht 67.0 in | Wt 214.6 lb

## 2019-10-22 DIAGNOSIS — R350 Frequency of micturition: Secondary | ICD-10-CM

## 2019-10-22 DIAGNOSIS — Z1231 Encounter for screening mammogram for malignant neoplasm of breast: Secondary | ICD-10-CM

## 2019-10-22 DIAGNOSIS — E119 Type 2 diabetes mellitus without complications: Secondary | ICD-10-CM | POA: Diagnosis not present

## 2019-10-22 DIAGNOSIS — Z0000 Encounter for general adult medical examination without abnormal findings: Secondary | ICD-10-CM

## 2019-10-22 DIAGNOSIS — E785 Hyperlipidemia, unspecified: Secondary | ICD-10-CM | POA: Diagnosis not present

## 2019-10-22 DIAGNOSIS — Z124 Encounter for screening for malignant neoplasm of cervix: Secondary | ICD-10-CM

## 2019-10-22 DIAGNOSIS — Z1211 Encounter for screening for malignant neoplasm of colon: Secondary | ICD-10-CM

## 2019-10-22 DIAGNOSIS — I10 Essential (primary) hypertension: Secondary | ICD-10-CM | POA: Diagnosis not present

## 2019-10-22 LAB — POC URINALSYSI DIPSTICK (AUTOMATED)
Bilirubin, UA: NEGATIVE
Blood, UA: NEGATIVE
Glucose, UA: NEGATIVE
Ketones, UA: NEGATIVE
Leukocytes, UA: NEGATIVE
Nitrite, UA: NEGATIVE
Protein, UA: NEGATIVE
Spec Grav, UA: 1.015 (ref 1.010–1.025)
Urobilinogen, UA: 0.2 E.U./dL
pH, UA: 6 (ref 5.0–8.0)

## 2019-10-22 MED ORDER — CHLORTHALIDONE 25 MG PO TABS: 25.0000 mg | ORAL_TABLET | Freq: Every day | ORAL | 3 refills | Status: DC

## 2019-10-22 MED ORDER — AMLODIPINE BESYLATE 5 MG PO TABS: 5.0000 mg | ORAL_TABLET | Freq: Every day | ORAL | 3 refills | Status: DC

## 2019-10-22 MED ORDER — METFORMIN HCL 1000 MG PO TABS: 1000.0000 mg | ORAL_TABLET | Freq: Two times a day (BID) | ORAL | 3 refills | Status: DC

## 2019-10-22 MED ORDER — METOPROLOL SUCCINATE ER 25 MG PO TB24: 25.0000 mg | ORAL_TABLET | Freq: Every day | ORAL | 3 refills | Status: DC

## 2019-10-22 NOTE — Addendum Note (Signed)
Addended by: Anabel Halon on: 10/22/2019 09:30 AM   Modules accepted: Orders

## 2019-10-22 NOTE — Progress Notes (Signed)
Subjective:    Patient ID: Christina Leblanc, female    DOB: 1970/01/20, 49 y.o.   MRN: [id number]  HPI   Pt in for follow up.  Pt will get wellness exam today.   At home late in evening her bp is 127/82. On amlodipine 5 mg daily. Chlorthalidone and metoprolol. Pt is on potassium tablets.  Has high cholesterol and is on crestor 40 mg daily.  Pt has diabetes. Last a1c in the spring was 6.6.  Pt is on metformin.   Pt is vaccinated against covid.   Pt declines both flu vaccine and pneumonia vaccine.  Pt appears to need papsmear. Will refer to gyn.    Review of Systems  Constitutional: Negative for chills and fatigue.  Eyes: Negative for pain.  Respiratory: Negative for cough, chest tightness, shortness of breath and wheezing.   Cardiovascular: Negative for chest pain and palpitations.  Gastrointestinal: Negative for abdominal pain, blood in stool and diarrhea.  Genitourinary: Negative for dysuria, flank pain and frequency.  Musculoskeletal: Negative for back pain and myalgias.  Skin: Negative for rash.  Neurological: Negative for dizziness, seizures, weakness, numbness and headaches.  Hematological: Negative for adenopathy. Does not bruise/bleed easily.  Psychiatric/Behavioral: Negative for behavioral problems and confusion.    Past Medical History:  Diagnosis Date  . Allergy   . Hyperlipidemia   . Hypertension      Social History   Socioeconomic History  . Marital status: Divorced    Spouse name: Not on file  . Number of children: Not on file  . Years of education: Not on file  . Highest education level: Not on file  Occupational History  . Not on file  Tobacco Use  . Smoking status: Never Smoker  . Smokeless tobacco: Never Used  Substance and Sexual Activity  . Alcohol use: Yes    Alcohol/week: 0.0 standard drinks    Comment: wine occasionally .Twice.  . Drug use: No  . Sexual activity: Not on file  Other Topics Concern  . Not on file  Social  History Narrative  . Not on file   Social Determinants of Health   Financial Resource Strain:   . Difficulty of Paying Living Expenses: Not on file  Food Insecurity:   . Worried About Charity fundraiser in the Last Year: Not on file  . Ran Out of Food in the Last Year: Not on file  Transportation Needs:   . Lack of Transportation (Medical): Not on file  . Lack of Transportation (Non-Medical): Not on file  Physical Activity:   . Days of Exercise per Week: Not on file  . Minutes of Exercise per Session: Not on file  Stress:   . Feeling of Stress : Not on file  Social Connections:   . Frequency of Communication with Friends and Family: Not on file  . Frequency of Social Gatherings with Friends and Family: Not on file  . Attends Religious Services: Not on file  . Active Member of Clubs or Organizations: Not on file  . Attends Archivist Meetings: Not on file  . Marital Status: Not on file  Intimate Partner Violence:   . Fear of Current or Ex-Partner: Not on file  . Emotionally Abused: Not on file  . Physically Abused: Not on file  . Sexually Abused: Not on file    Past Surgical History:  Procedure Laterality Date  . ABDOMINAL HYSTERECTOMY     2003    Family History  Problem  Relation Age of Onset  . Diabetes Father   . Heart disease Father   . Diabetes Mother   . Cancer Brother   . Hypertension Brother     Allergies  Allergen Reactions  . Claritin [Loratadine] Palpitations    Current Outpatient Medications on File Prior to Visit  Medication Sig Dispense Refill  . acetaminophen (TYLENOL) 500 MG tablet Take 500 mg by mouth every 6 (six) hours as needed.    Marland Kitchen amLODipine (NORVASC) 5 MG tablet Take 1 tablet by mouth once daily 90 tablet 0  . blood glucose meter kit and supplies Check blood sugar daily  (E11.9). 1 each 0  . chlorthalidone (HYGROTON) 25 MG tablet Take 1 tablet by mouth once daily 90 tablet 0  . levocetirizine (XYZAL) 5 MG tablet Take 1 tablet  (5 mg total) by mouth every evening. 90 tablet 3  . metFORMIN (GLUCOPHAGE) 1000 MG tablet TAKE 1 TABLET BY MOUTH TWICE DAILY WITH MEALS 60 tablet 0  . metoprolol succinate (TOPROL-XL) 25 MG 24 hr tablet Take 1 tablet by mouth once daily 30 tablet 0  . potassium chloride SA (KLOR-CON) 20 MEQ tablet TAKE 1  BY MOUTH ONCE DAILY 30 tablet 0  . rosuvastatin (CRESTOR) 40 MG tablet Take 1 tablet by mouth once daily 90 tablet 0  . Azelastine HCl 137 MCG/SPRAY SOLN Place 2 sprays into both nostrils at bedtime as needed. (Patient not taking: Reported on 10/22/2019) 30 mL 5  . azithromycin (ZITHROMAX) 250 MG tablet Take 2 tablets by mouth on day 1, followed by 1 tablet by mouth daily for 4 days. (Patient not taking: Reported on 10/22/2019) 6 tablet 0  . benzonatate (TESSALON) 100 MG capsule Take 1 capsule (100 mg total) by mouth 3 (three) times daily as needed for cough. (Patient not taking: Reported on 10/22/2019) 30 capsule 0  . cyclobenzaprine (FLEXERIL) 5 MG tablet Take 1 tablet (5 mg total) by mouth at bedtime. (Patient not taking: Reported on 10/22/2019) 10 tablet 0  . doxycycline (VIBRA-TABS) 100 MG tablet Take 1 tablet (100 mg total) by mouth 2 (two) times daily. Can give caps or generic. (Patient not taking: Reported on 10/22/2019) 14 tablet 0  . fluticasone (FLONASE) 50 MCG/ACT nasal spray Place 2 sprays into both nostrils daily. (Patient not taking: Reported on 10/22/2019) 16 g 1  . HYDROcodone-homatropine (HYCODAN) 5-1.5 MG/5ML syrup Take 5 mLs by mouth at bedtime as needed. (Patient not taking: Reported on 10/22/2019) 100 mL 0  . meloxicam (MOBIC) 7.5 MG tablet Take 1 tablet (7.5 mg total) by mouth daily. (Patient not taking: Reported on 10/22/2019) 14 tablet 0  . NON FORMULARY  (Patient not taking: Reported on 10/22/2019)     No current facility-administered medications on file prior to visit.    BP (!) 144/77   Pulse 79   Temp 98.2 F (36.8 C) (Oral)   Resp 18   Ht $R'5\' 7"'bg$  (1.702 m)   Wt 214 lb 9.6  oz (97.3 kg)   SpO2 90%   BMI 33.61 kg/m       Objective:   Physical Exam  General Mental Status- Alert. General Appearance- Not in acute distress.   Skin General: Color- Normal Color. Moisture- Normal Moisture.  Neck Carotid Arteries- Normal color. Moisture- Normal Moisture. No carotid bruits. No JVD.  Chest and Lung Exam Auscultation: Breath Sounds:-Normal.  Cardiovascular Auscultation:Rythm- Regular. Murmurs & Other Heart Sounds:Auscultation of the heart reveals- No Murmurs.  Abdomen Inspection:-Inspeection Normal. Palpation/Percussion:Note:No mass. Palpation and Percussion  of the abdomen reveal- Non Tender, Non Distended + BS, no rebound or guarding.    Neurologic Cranial Nerve exam:- CN III-XII intact(No nystagmus), symmetric smile. Strength:- 5/5 equal and symmetric strength both upper and lower extremities.      Assessment & Plan:  For you wellness exam today I have ordered cbc, cmp, lipid panel and a1c.  Vaccine flu and pneumonia declined today  Recommend exercise and healthy diet.  We will let you know lab results as they come in.  Follow up date appointment will be determined after lab review.   For htn continue amlodipine, chlorthalidone and metoprolol.  For high cholesterol continue crestor.  For diabetes continue metformin.  Referred to gyn for pap. Referred to gi MD for colonscopy. Placed mammogram order.  Follow up 3-6 months or as needed. More specific recommendation after lab review.  [id number] as addressed htn, high cholesterol and diabetes today.  Mackie Pai, PA-C

## 2019-10-22 NOTE — Patient Instructions (Addendum)
For you wellness exam today I have ordered cbc, cmp, lipid panel and a1c.  Vaccine flu and pneumonia declined today  Recommend exercise and healthy diet.  We will let you know lab results as they come in.  Follow up date appointment will be determined after lab review.   For htn continue amlodipine, chlorthalidone and metoprolol.  For high cholesterol continue crestor.  For diabetes continue metformin.  Referred to gyn for pap. Referred to gi MD for colonscopy. Placed mammogram order.  Follow up 3-6 months or as needed. More specific recommendation after lab review.   Preventive Care 71-26 Years Old, Female Preventive care refers to visits with your health care provider and lifestyle choices that can promote health and wellness. This includes:  A yearly physical exam. This may also be called an annual well check.  Regular dental visits and eye exams.  Immunizations.  Screening for certain conditions.  Healthy lifestyle choices, such as eating a healthy diet, getting regular exercise, not using drugs or products that contain nicotine and tobacco, and limiting alcohol use. What can I expect for my preventive care visit? Physical exam Your health care provider will check your:  Height and weight. This may be used to calculate body mass index (BMI), which tells if you are at a healthy weight.  Heart rate and blood pressure.  Skin for abnormal spots. Counseling Your health care provider may ask you questions about your:  Alcohol, tobacco, and drug use.  Emotional well-being.  Home and relationship well-being.  Sexual activity.  Eating habits.  Work and work Statistician.  Method of birth control.  Menstrual cycle.  Pregnancy history. What immunizations do I need?  Influenza (flu) vaccine  This is recommended every year. Tetanus, diphtheria, and pertussis (Tdap) vaccine  You may need a Td booster every 10 years. Varicella (chickenpox) vaccine  You  may need this if you have not been vaccinated. Zoster (shingles) vaccine  You may need this after age 65. Measles, mumps, and rubella (MMR) vaccine  You may need at least one dose of MMR if you were born in 1957 or later. You may also need a second dose. Pneumococcal conjugate (PCV13) vaccine  You may need this if you have certain conditions and were not previously vaccinated. Pneumococcal polysaccharide (PPSV23) vaccine  You may need one or two doses if you smoke cigarettes or if you have certain conditions. Meningococcal conjugate (MenACWY) vaccine  You may need this if you have certain conditions. Hepatitis A vaccine  You may need this if you have certain conditions or if you travel or work in places where you may be exposed to hepatitis A. Hepatitis B vaccine  You may need this if you have certain conditions or if you travel or work in places where you may be exposed to hepatitis B. Haemophilus influenzae type b (Hib) vaccine  You may need this if you have certain conditions. Human papillomavirus (HPV) vaccine  If recommended by your health care provider, you may need three doses over 6 months. You may receive vaccines as individual doses or as more than one vaccine together in one shot (combination vaccines). Talk with your health care provider about the risks and benefits of combination vaccines. What tests do I need? Blood tests  Lipid and cholesterol levels. These may be checked every 5 years, or more frequently if you are over 39 years old.  Hepatitis C test.  Hepatitis B test. Screening  Lung cancer screening. You may have this screening every  year starting at age 73 if you have a 30-pack-year history of smoking and currently smoke or have quit within the past 15 years.  Colorectal cancer screening. All adults should have this screening starting at age 64 and continuing until age 35. Your health care provider may recommend screening at age 55 if you are at increased  risk. You will have tests every 1-10 years, depending on your results and the type of screening test.  Diabetes screening. This is done by checking your blood sugar (glucose) after you have not eaten for a while (fasting). You may have this done every 1-3 years.  Mammogram. This may be done every 1-2 years. Talk with your health care provider about when you should start having regular mammograms. This may depend on whether you have a family history of breast cancer.  BRCA-related cancer screening. This may be done if you have a family history of breast, ovarian, tubal, or peritoneal cancers.  Pelvic exam and Pap test. This may be done every 3 years starting at age 63. Starting at age 27, this may be done every 5 years if you have a Pap test in combination with an HPV test. Other tests  Sexually transmitted disease (STD) testing.  Bone density scan. This is done to screen for osteoporosis. You may have this scan if you are at high risk for osteoporosis. Follow these instructions at home: Eating and drinking  Eat a diet that includes fresh fruits and vegetables, whole grains, lean protein, and low-fat dairy.  Take vitamin and mineral supplements as recommended by your health care provider.  Do not drink alcohol if: ? Your health care provider tells you not to drink. ? You are pregnant, may be pregnant, or are planning to become pregnant.  If you drink alcohol: ? Limit how much you have to 0-1 drink a day. ? Be aware of how much alcohol is in your drink. In the [place] one drink equals one 12 oz bottle of beer (355 mL), one 5 oz glass of wine (148 mL), or one 1 oz glass of hard liquor (44 mL). Lifestyle  Take daily care of your teeth and gums.  Stay active. Exercise for at least 30 minutes on 5 or more days each week.  Do not use any products that contain nicotine or tobacco, such as cigarettes, e-cigarettes, and chewing tobacco. If you need help quitting, ask your health care  provider.  If you are sexually active, practice safe sex. Use a condom or other form of birth control (contraception) in order to prevent pregnancy and STIs (sexually transmitted infections).  If told by your health care provider, take low-dose aspirin daily starting at age 70. What's next?  Visit your health care provider once a year for a well check visit.  Ask your health care provider how often you should have your eyes and teeth checked.  Stay up to date on all vaccines. This information is not intended to replace advice given to you by your health care provider. Make sure you discuss any questions you have with your health care provider. Document Revised: 09/14/2017 Document Reviewed: 09/14/2017 Elsevier Patient Education  2020 [employer].

## 2019-10-23 ENCOUNTER — Other Ambulatory Visit (INDEPENDENT_AMBULATORY_CARE_PROVIDER_SITE_OTHER): Payer: BC Managed Care – PPO

## 2019-10-23 DIAGNOSIS — Z0000 Encounter for general adult medical examination without abnormal findings: Secondary | ICD-10-CM

## 2019-10-23 DIAGNOSIS — I10 Essential (primary) hypertension: Secondary | ICD-10-CM

## 2019-10-23 DIAGNOSIS — E119 Type 2 diabetes mellitus without complications: Secondary | ICD-10-CM

## 2019-10-23 DIAGNOSIS — E785 Hyperlipidemia, unspecified: Secondary | ICD-10-CM

## 2019-10-24 ENCOUNTER — Telehealth: Payer: Self-pay | Admitting: Medical

## 2019-10-24 LAB — CBC WITH DIFFERENTIAL/PLATELET
Absolute Monocytes: 412 cells/uL (ref 200–950)
Basophils Absolute: 20 cells/uL (ref 0–200)
Basophils Relative: 0.4 %
Eosinophils Absolute: 49 cells/uL (ref 15–500)
Eosinophils Relative: 1 %
HCT: 39.9 % (ref 35.0–45.0)
Hemoglobin: 12.7 g/dL (ref 11.7–15.5)
Lymphs Abs: 2068 cells/uL (ref 850–3900)
MCH: 26.7 pg — ABNORMAL LOW (ref 27.0–33.0)
MCHC: 31.8 g/dL — ABNORMAL LOW (ref 32.0–36.0)
MCV: 84 fL (ref 80.0–100.0)
MPV: 10.8 fL (ref 7.5–12.5)
Monocytes Relative: 8.4 %
Neutro Abs: 2352 cells/uL (ref 1500–7800)
Neutrophils Relative %: 48 %
Platelets: 310 10*3/uL (ref 140–400)
RBC: 4.75 10*6/uL (ref 3.80–5.10)
RDW: 14.3 % (ref 11.0–15.0)
Total Lymphocyte: 42.2 %
WBC: 4.9 10*3/uL (ref 3.8–10.8)

## 2019-10-24 LAB — HEMOGLOBIN A1C
Hgb A1c MFr Bld: 6.6 % of total Hgb — ABNORMAL HIGH (ref <5.7)
Mean Plasma Glucose: 143 (calc)
eAG (mmol/L): 7.9 (calc)

## 2019-10-24 LAB — LIPID PANEL
Cholesterol: 135 mg/dL (ref <200)
HDL: 46 mg/dL — ABNORMAL LOW (ref >=50)
LDL Cholesterol (Calc): 75 mg/dL (calc)
Non-HDL Cholesterol (Calc): 89 mg/dL (calc) (ref <130)
Total CHOL/HDL Ratio: 2.9 (calc) (ref <5.0)
Triglycerides: 60 mg/dL (ref <150)

## 2019-10-24 LAB — COMPREHENSIVE METABOLIC PANEL
AG Ratio: 1.5 (calc) (ref 1.0–2.5)
ALT: 18 U/L (ref 6–29)
AST: 22 U/L (ref 10–35)
Albumin: 4.2 g/dL (ref 3.6–5.1)
Alkaline phosphatase (APISO): 51 U/L (ref 37–153)
BUN: 9 mg/dL (ref 7–25)
CO2: 29 mmol/L (ref 20–32)
Calcium: 9.5 mg/dL (ref 8.6–10.4)
Chloride: 101 mmol/L (ref 98–110)
Creat: 0.86 mg/dL (ref 0.50–1.05)
Globulin: 2.8 g/dL (calc) (ref 1.9–3.7)
Glucose, Bld: 94 mg/dL (ref 65–99)
Potassium: 3.8 mmol/L (ref 3.5–5.3)
Sodium: 140 mmol/L (ref 135–146)
Total Bilirubin: 0.3 mg/dL (ref 0.2–1.2)
Total Protein: 7 g/dL (ref 6.1–8.1)

## 2019-10-24 MED ORDER — ROSUVASTATIN CALCIUM 40 MG PO TABS: 40.0000 mg | ORAL_TABLET | Freq: Every day | ORAL | 3 refills | Status: DC

## 2019-10-24 NOTE — Telephone Encounter (Signed)
Rx crestor sent to pt pharmacy. 

## 2019-11-05 ENCOUNTER — Encounter (HOSPITAL_BASED_OUTPATIENT_CLINIC_OR_DEPARTMENT_OTHER): Payer: Self-pay

## 2019-11-05 ENCOUNTER — Other Ambulatory Visit: Payer: Self-pay

## 2019-11-05 ENCOUNTER — Ambulatory Visit (HOSPITAL_BASED_OUTPATIENT_CLINIC_OR_DEPARTMENT_OTHER)
Admission: RE | Admit: 2019-11-05 | Discharge: 2019-11-05 | Disposition: A | Discharge status: Home / Self Care | Payer: BC Managed Care – PPO | Source: Ambulatory Visit | Attending: Medical | Admitting: Medical

## 2019-11-05 DIAGNOSIS — Z1231 Encounter for screening mammogram for malignant neoplasm of breast: Secondary | ICD-10-CM | POA: Insufficient documentation

## 2019-11-15 ENCOUNTER — Other Ambulatory Visit: Payer: Self-pay | Admitting: Medical

## 2019-12-30 ENCOUNTER — Encounter: Payer: Self-pay | Admitting: Medical

## 2020-01-21 IMAGING — CR DG CHEST 2V
2 series · 2 of 2 positions shown · non-contrast
Comparison: None.

CLINICAL DATA: Cough for 3 weeks

EXAM:
CHEST - 2 VIEW

[w chest pa]
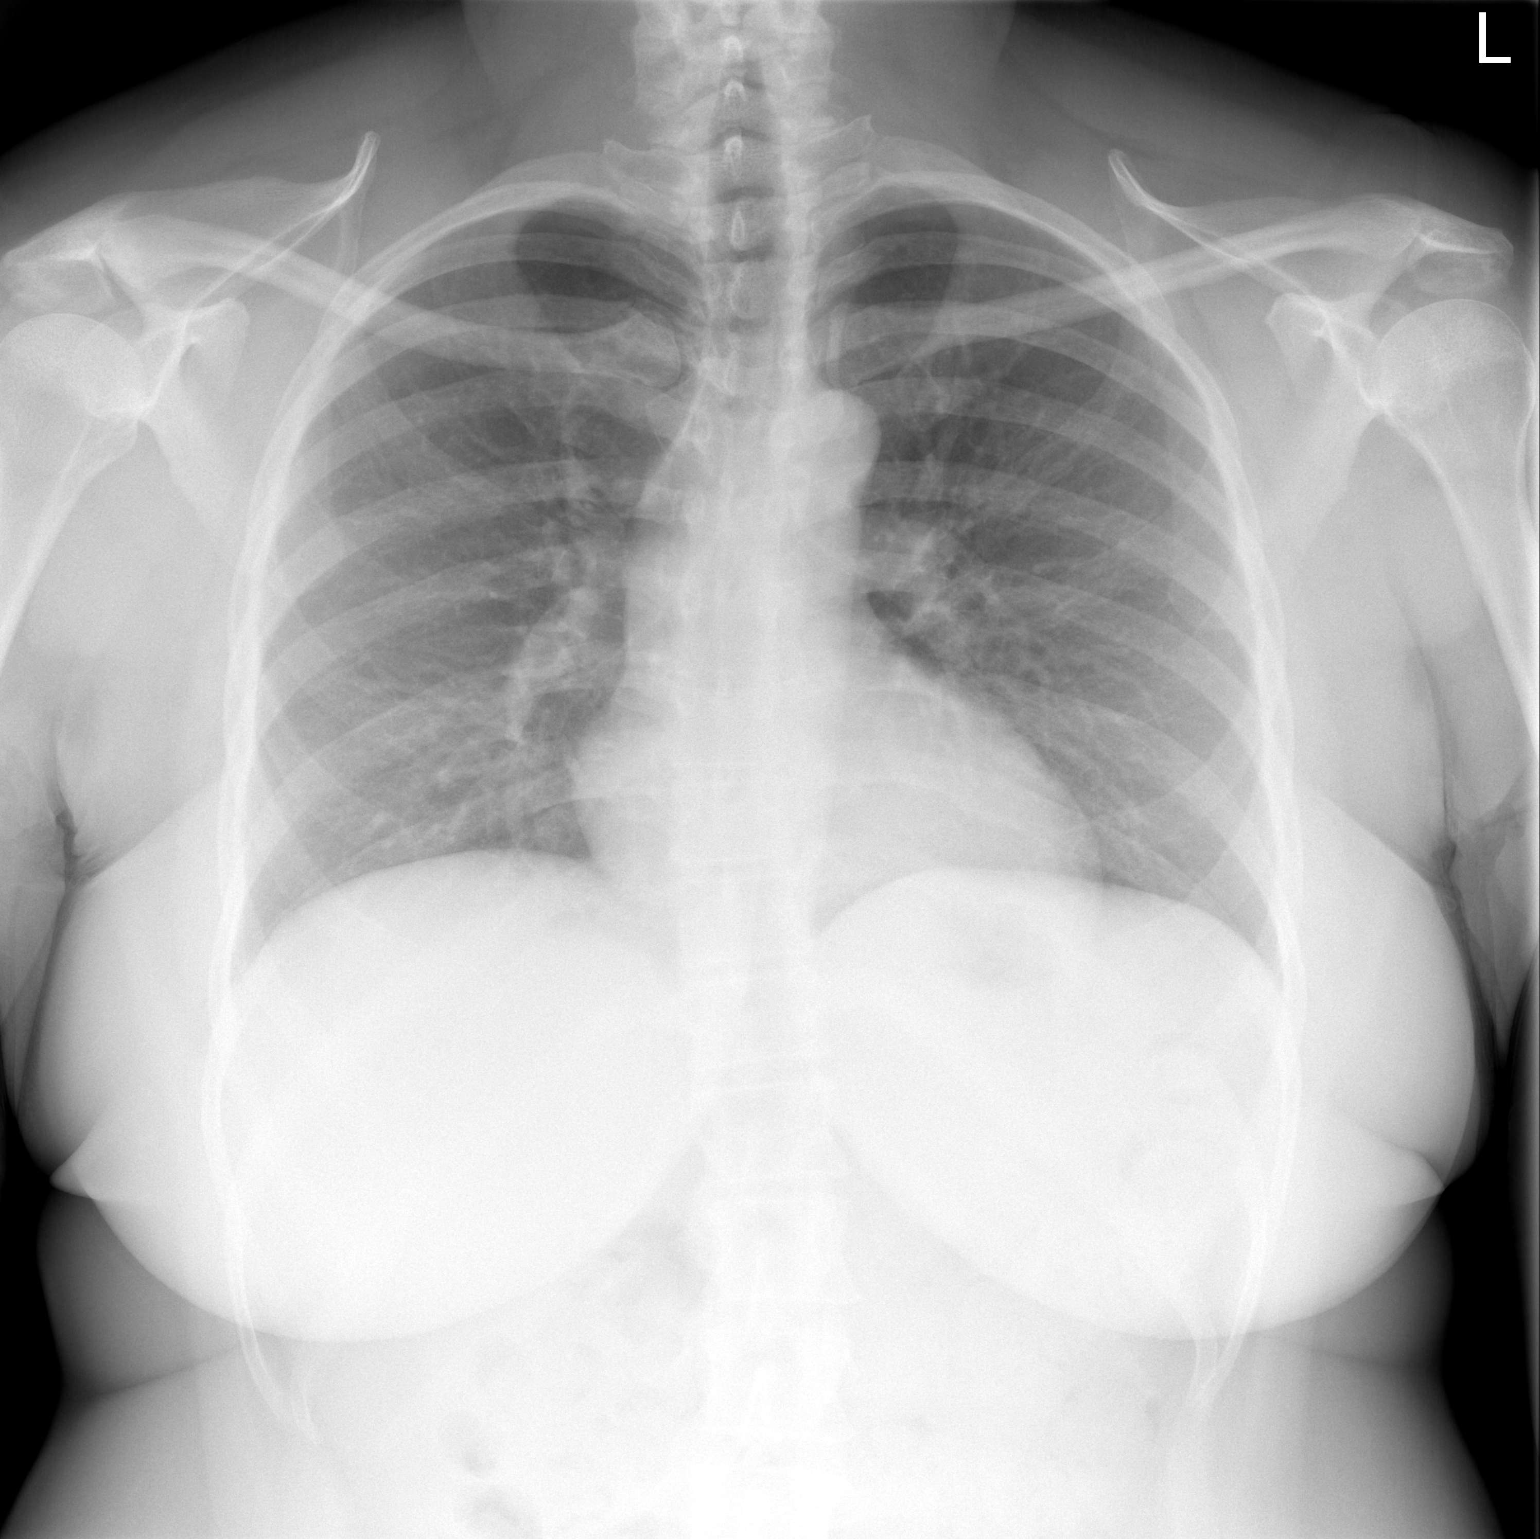

[w chest lat]
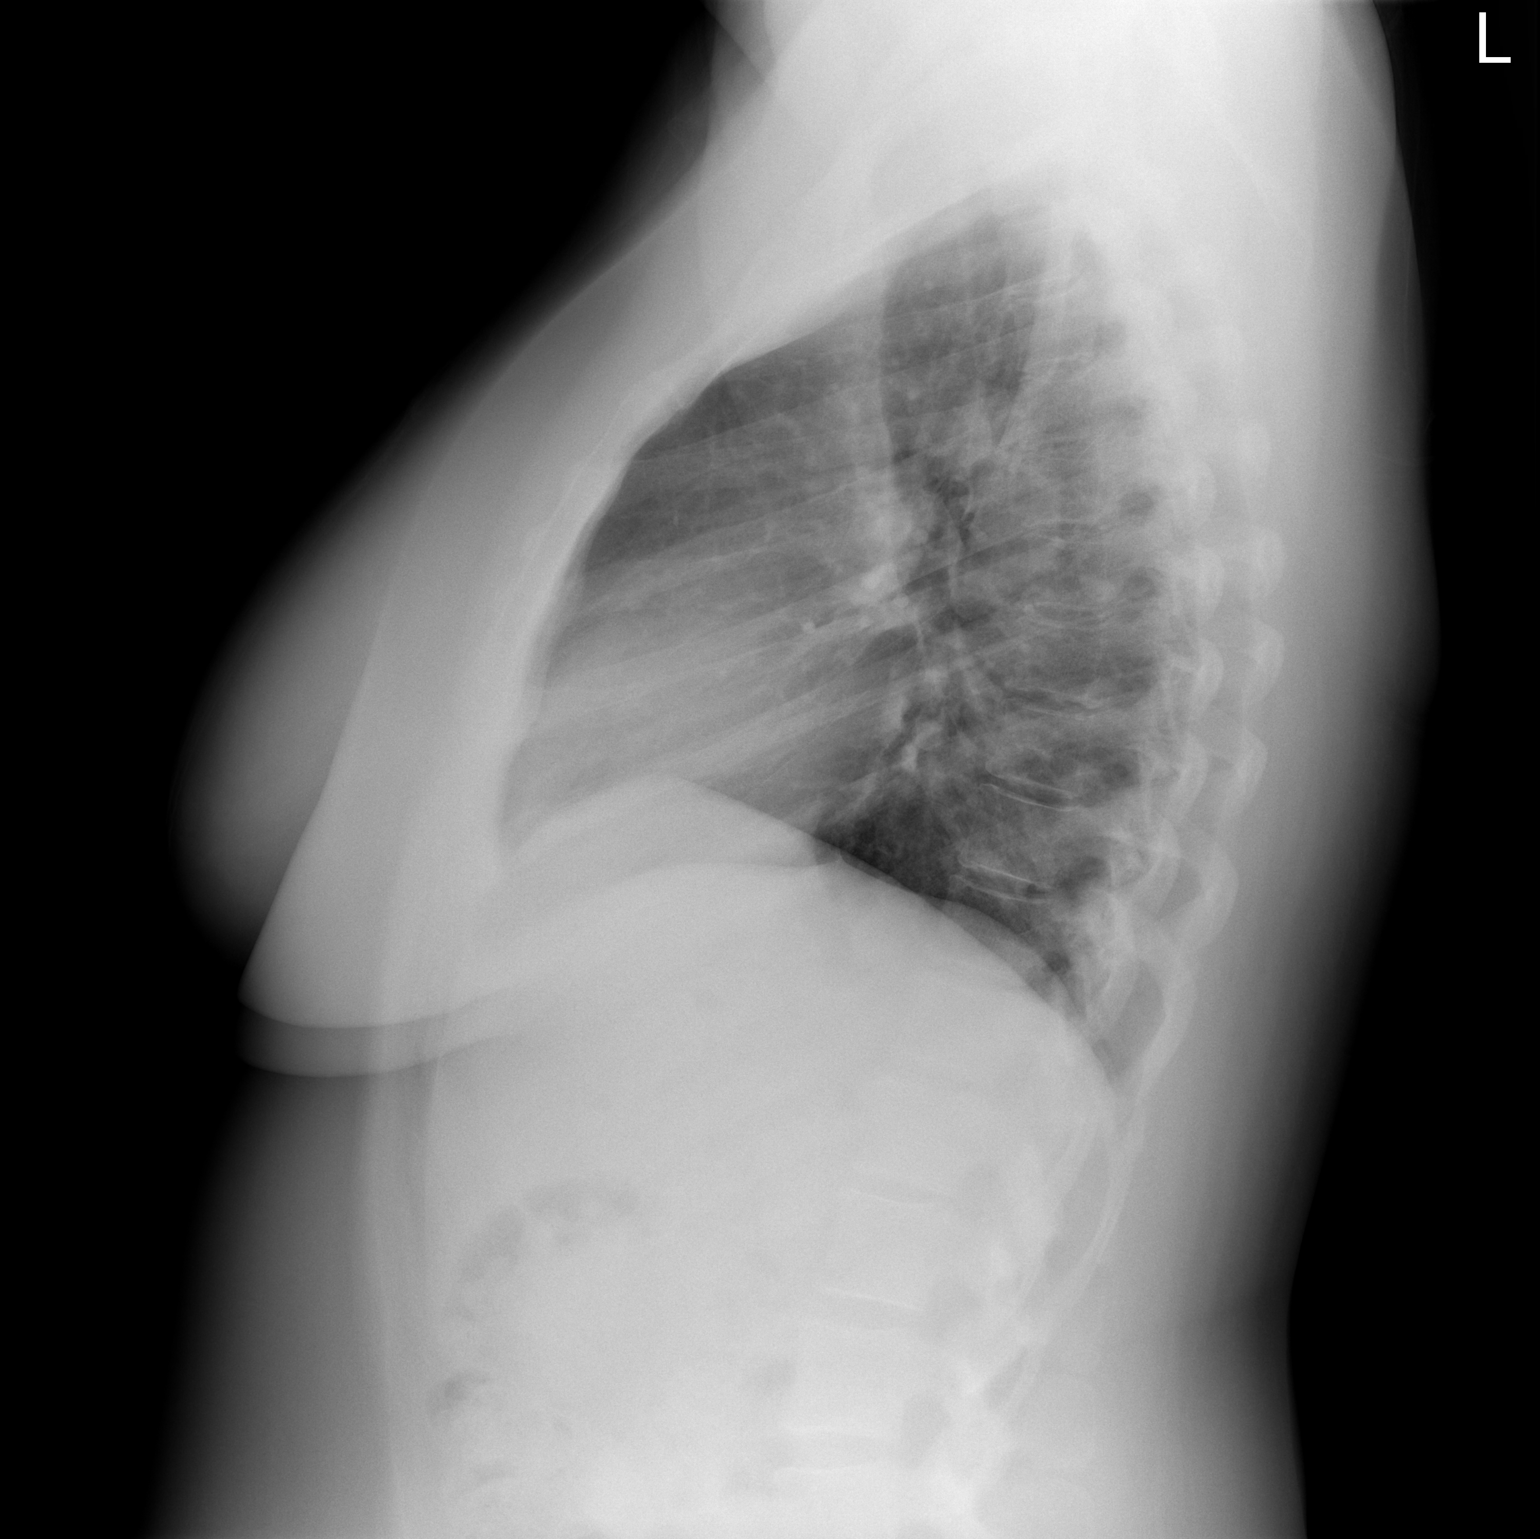

[2 of 2 positions shown; findings below may reference images not displayed]

FINDINGS: No active infiltrate or effusion is seen. Mediastinal and hilar
contours are unremarkable. The heart is within normal limits in
size. No bony abnormality is seen.
IMPRESSION: No active cardiopulmonary disease.

## 2020-05-06 ENCOUNTER — Other Ambulatory Visit: Payer: Self-pay | Admitting: Medical

## 2020-05-13 ENCOUNTER — Ambulatory Visit (INDEPENDENT_AMBULATORY_CARE_PROVIDER_SITE_OTHER): Payer: BC Managed Care – PPO | Admitting: Family Medicine

## 2020-05-13 ENCOUNTER — Other Ambulatory Visit (HOSPITAL_COMMUNITY)
Admission: RE | Admit: 2020-05-13 | Discharge: 2020-05-13 | Disposition: A | Discharge status: Home / Self Care | Payer: BC Managed Care – PPO | Source: Ambulatory Visit | Attending: Family Medicine | Admitting: Family Medicine

## 2020-05-13 ENCOUNTER — Other Ambulatory Visit: Payer: Self-pay

## 2020-05-13 ENCOUNTER — Encounter: Payer: Self-pay | Admitting: Family Medicine

## 2020-05-13 VITALS — BP 165/70 | HR 80 | Ht 67.0 in | Wt 225.0 lb

## 2020-05-13 DIAGNOSIS — Z01419 Encounter for gynecological examination (general) (routine) without abnormal findings: Secondary | ICD-10-CM | POA: Insufficient documentation

## 2020-05-13 DIAGNOSIS — Z1211 Encounter for screening for malignant neoplasm of colon: Secondary | ICD-10-CM

## 2020-05-13 DIAGNOSIS — I10 Essential (primary) hypertension: Secondary | ICD-10-CM | POA: Diagnosis not present

## 2020-05-13 NOTE — Progress Notes (Signed)
NEW PATIENT GYNECOLOGY ANNUAL PREVENTATIVE CARE ENCOUNTER NOTE  Subjective:   Christina Leblanc is a 50 y.o. No obstetric history on file. female here for a routine annual gynecologic exam.  Current complaints: no significant complaints. She does have mild HF and anxiety that is not interfering with daily activities.   Denies abnormal vaginal bleeding, discharge, pelvic pain, problems with intercourse or other gynecologic concerns.    Gynecologic History No LMP recorded. Patient has had a hysterectomy. Patient is not sexually active  Contraception: status post hysterectomy Last Pap: 12/2014. Results were: normal. Was found to have trichomonas as that time and she and her partner were appropriately treated  Last mammogram: 11/06/20. Results were: normal  Obstetric History OB History  Gravida Para Term Preterm AB Living  0 0 0 0 0 0  SAB IAB Ectopic Multiple Live Births  0 0 0 0 0    Past Medical History:  Diagnosis Date  . Allergy   . Hyperlipidemia   . Hypertension     Past Surgical History:  Procedure Laterality Date  . ABDOMINAL HYSTERECTOMY     2003    Current Outpatient Medications on File Prior to Visit  Medication Sig Dispense Refill  . acetaminophen (TYLENOL) 500 MG tablet Take 500 mg by mouth every 6 (six) hours as needed.    Marland Kitchen amLODipine (NORVASC) 5 MG tablet Take 1 tablet (5 mg total) by mouth daily. 90 tablet 3  . Azelastine HCl 137 MCG/SPRAY SOLN USE 2 SPRAY(S) IN EACH NOSTRIL AT BEDTIME AS NEEDED 30 mL 0  . blood glucose meter kit and supplies Check blood sugar daily  (E11.9). 1 each 0  . chlorthalidone (HYGROTON) 25 MG tablet Take 1 tablet (25 mg total) by mouth daily. 90 tablet 3  . levocetirizine (XYZAL) 5 MG tablet TAKE 1 TABLET BY MOUTH ONCE DAILY IN THE EVENING 90 tablet 0  . metFORMIN (GLUCOPHAGE) 1000 MG tablet Take 1 tablet (1,000 mg total) by mouth 2 (two) times daily with a meal. 180 tablet 3  . metoprolol succinate (TOPROL-XL) 25 MG 24 hr tablet  Take 1 tablet (25 mg total) by mouth daily. 90 tablet 3  . rosuvastatin (CRESTOR) 40 MG tablet Take 1 tablet (40 mg total) by mouth daily. 90 tablet 3  . azithromycin (ZITHROMAX) 250 MG tablet Take 2 tablets by mouth on day 1, followed by 1 tablet by mouth daily for 4 days. (Patient not taking: No sig reported) 6 tablet 0  . benzonatate (TESSALON) 100 MG capsule Take 1 capsule (100 mg total) by mouth 3 (three) times daily as needed for cough. (Patient not taking: No sig reported) 30 capsule 0  . cyclobenzaprine (FLEXERIL) 5 MG tablet Take 1 tablet (5 mg total) by mouth at bedtime. (Patient not taking: No sig reported) 10 tablet 0  . doxycycline (VIBRA-TABS) 100 MG tablet Take 1 tablet (100 mg total) by mouth 2 (two) times daily. Can give caps or generic. (Patient not taking: No sig reported) 14 tablet 0  . fluticasone (FLONASE) 50 MCG/ACT nasal spray Place 2 sprays into both nostrils daily. (Patient not taking: No sig reported) 16 g 1  . HYDROcodone-homatropine (HYCODAN) 5-1.5 MG/5ML syrup Take 5 mLs by mouth at bedtime as needed. (Patient not taking: No sig reported) 100 mL 0  . meloxicam (MOBIC) 7.5 MG tablet Take 1 tablet (7.5 mg total) by mouth daily. (Patient not taking: No sig reported) 14 tablet 0  . NON FORMULARY  (Patient not taking: No  sig reported)    . potassium chloride SA (KLOR-CON) 20 MEQ tablet TAKE 1  BY MOUTH ONCE DAILY (Patient not taking: Reported on 05/13/2020) 30 tablet 0   No current facility-administered medications on file prior to visit.    Allergies  Allergen Reactions  . Claritin [Loratadine] Palpitations    Social History   Socioeconomic History  . Marital status: Divorced    Spouse name: Not on file  . Number of children: Not on file  . Years of education: Not on file  . Highest education level: Not on file  Occupational History  . Not on file  Tobacco Use  . Smoking status: Never Smoker  . Smokeless tobacco: Never Used  Substance and Sexual Activity  .  Alcohol use: Yes    Alcohol/week: 0.0 standard drinks    Comment: wine occasionally .Twice.  . Drug use: No  . Sexual activity: Not Currently    Birth control/protection: Surgical  Other Topics Concern  . Not on file  Social History Narrative  . Not on file   Social Determinants of Health   Financial Resource Strain: Not on file  Food Insecurity: Not on file  Transportation Needs: Not on file  Physical Activity: Not on file  Stress: Not on file  Social Connections: Not on file  Intimate Partner Violence: Not on file    Family History  Problem Relation Age of Onset  . Diabetes Father   . Heart disease Father   . Diabetes Mother   . Cancer Brother   . Hypertension Brother     The following portions of the patient's history were reviewed and updated as appropriate: allergies, current medications, past family history, past medical history, past social history, past surgical history and problem list.  Review of Systems Pertinent items are noted in HPI.   Objective:  BP (!) 165/70   Pulse 80   Ht _0  (1.702 m)   Wt 225 lb (102.1 kg)   BMI 35.24 kg/m  Wt Readings from Last 3 Encounters:  05/13/20 225 lb (102.1 kg)  10/22/19 214 lb 9.6 oz (97.3 kg)  05/14/19 205 lb 12.8 oz (93.4 kg)     Chaperone present during exam  CONSTITUTIONAL: Well-developed, well-nourished female in no acute distress.  HENT:  Normocephalic, atraumatic EYES: Conjunctivae and EOM are normal. NECK: Normal range of motion, supple, no masses.  Normal thyroid.   CARDIOVASCULAR: Normal heart rate noted, regular rhythm RESPIRATORY: Clear to auscultation bilaterally. Effort and breath sounds normal, no problems with respiration noted. BREASTS: Symmetric in size. No masses, skin changes, nipple drainage, or lymphadenopathy.  PELVIC: Normal appearing external genitalia; normal appearing vaginal mucosa and cervix.  No abnormal discharge noted.  Normal uterine size, no other palpable masses, no uterine  or adnexal tenderness. SKIN: Skin is warm and dry. No rash noted. Not diaphoretic. No erythema. No pallor. NEUROLOGIC: Alert and oriented to person, place, and time.  PSYCHIATRIC: Normal mood and affect. Normal behavior. Normal judgment and thought content.  Assessment:  Annual gynecologic examination with pap smear   Plan:  1. Well Woman Exam Will follow up results of pap smear and manage accordingly. Repeat Mammogram with PCP in 10/2020 Schedule screening colonoscopy  STD testing discussed. Patient declined testing Discussed menopausal symptoms   2. Hypertension  157/96, repeat 165/70. Takes meds at night. Feels nervous being here. Follow up with PCP   Routine preventative health maintenance measures emphasized. Please refer to After Visit Summary for other counseling recommendations.  Dalene Carrow, Medical Student

## 2020-05-13 NOTE — Progress Notes (Signed)
GYNECOLOGY ANNUAL PREVENTATIVE CARE ENCOUNTER NOTE  Subjective:   Christina Leblanc is a 50 y.o. [id number] female here for a routine annual gynecologic exam.  Current complaints: occasional hot flashes. Minimal night sweats. Occasionally notices vaginal dryness.   Denies abnormal vaginal bleeding, discharge, pelvic pain, problems with intercourse or other gynecologic concerns.    Not sexually active. Never been pregnant. Had supracervical abdominal hysterectomy  Gynecologic History No LMP recorded. Patient has had a hysterectomy. Patient is not sexually active  Contraception: none Last Pap: 2016. Results were: normal Last mammogram: 10/2019. Results were: normal  Obstetric History OB History  Gravida Para Term Preterm AB Living  0 0 0 0 0 0  SAB IAB Ectopic Multiple Live Births  0 0 0 0 0    Past Medical History:  Diagnosis Date  . Allergy   . Hyperlipidemia   . Hypertension     Past Surgical History:  Procedure Laterality Date  . ABDOMINAL HYSTERECTOMY     2003    Current Outpatient Medications on File Prior to Visit  Medication Sig Dispense Refill  . acetaminophen (TYLENOL) 500 MG tablet Take 500 mg by mouth every 6 (six) hours as needed.    Marland Kitchen amLODipine (NORVASC) 5 MG tablet Take 1 tablet (5 mg total) by mouth daily. 90 tablet 3  . Azelastine HCl 137 MCG/SPRAY SOLN USE 2 SPRAY(S) IN EACH NOSTRIL AT BEDTIME AS NEEDED 30 mL 0  . blood glucose meter kit and supplies Check blood sugar daily  (E11.9). 1 each 0  . chlorthalidone (HYGROTON) 25 MG tablet Take 1 tablet (25 mg total) by mouth daily. 90 tablet 3  . levocetirizine (XYZAL) 5 MG tablet TAKE 1 TABLET BY MOUTH ONCE DAILY IN THE EVENING 90 tablet 0  . metFORMIN (GLUCOPHAGE) 1000 MG tablet Take 1 tablet (1,000 mg total) by mouth 2 (two) times daily with a meal. 180 tablet 3  . metoprolol succinate (TOPROL-XL) 25 MG 24 hr tablet Take 1 tablet (25 mg total) by mouth daily. 90 tablet 3  . rosuvastatin (CRESTOR) 40 MG  tablet Take 1 tablet (40 mg total) by mouth daily. 90 tablet 3  . azithromycin (ZITHROMAX) 250 MG tablet Take 2 tablets by mouth on day 1, followed by 1 tablet by mouth daily for 4 days. (Patient not taking: No sig reported) 6 tablet 0  . benzonatate (TESSALON) 100 MG capsule Take 1 capsule (100 mg total) by mouth 3 (three) times daily as needed for cough. (Patient not taking: No sig reported) 30 capsule 0  . cyclobenzaprine (FLEXERIL) 5 MG tablet Take 1 tablet (5 mg total) by mouth at bedtime. (Patient not taking: No sig reported) 10 tablet 0  . doxycycline (VIBRA-TABS) 100 MG tablet Take 1 tablet (100 mg total) by mouth 2 (two) times daily. Can give caps or generic. (Patient not taking: No sig reported) 14 tablet 0  . fluticasone (FLONASE) 50 MCG/ACT nasal spray Place 2 sprays into both nostrils daily. (Patient not taking: No sig reported) 16 g 1  . HYDROcodone-homatropine (HYCODAN) 5-1.5 MG/5ML syrup Take 5 mLs by mouth at bedtime as needed. (Patient not taking: No sig reported) 100 mL 0  . meloxicam (MOBIC) 7.5 MG tablet Take 1 tablet (7.5 mg total) by mouth daily. (Patient not taking: No sig reported) 14 tablet 0  . NON FORMULARY  (Patient not taking: No sig reported)    . potassium chloride SA (KLOR-CON) 20 MEQ tablet TAKE 1  BY MOUTH ONCE DAILY (Patient  not taking: Reported on 05/13/2020) 30 tablet 0   No current facility-administered medications on file prior to visit.    Allergies  Allergen Reactions  . Claritin [Loratadine] Palpitations    Social History   Socioeconomic History  . Marital status: Divorced    Spouse name: Not on file  . Number of children: Not on file  . Years of education: Not on file  . Highest education level: Not on file  Occupational History  . Not on file  Tobacco Use  . Smoking status: Never Smoker  . Smokeless tobacco: Never Used  Substance and Sexual Activity  . Alcohol use: Yes    Alcohol/week: 0.0 standard drinks    Comment: wine occasionally  .Twice.  . Drug use: No  . Sexual activity: Not Currently    Birth control/protection: Surgical  Other Topics Concern  . Not on file  Social History Narrative  . Not on file   Social Determinants of Health   Financial Resource Strain: Not on file  Food Insecurity: Not on file  Transportation Needs: Not on file  Physical Activity: Not on file  Stress: Not on file  Social Connections: Not on file  Intimate Partner Violence: Not on file    Family History  Problem Relation Age of Onset  . Diabetes Father   . Heart disease Father   . Diabetes Mother   . Cancer Brother   . Hypertension Brother     The following portions of the patient's history were reviewed and updated as appropriate: allergies, current medications, past family history, past medical history, past social history, past surgical history and problem list.  Review of Systems Pertinent items are noted in HPI.   Objective:  BP (!) 165/70   Pulse 80   Ht _0  (1.702 m)   Wt 225 lb (102.1 kg)   BMI 35.24 kg/m  Wt Readings from Last 3 Encounters:  05/13/20 225 lb (102.1 kg)  10/22/19 214 lb 9.6 oz (97.3 kg)  05/14/19 205 lb 12.8 oz (93.4 kg)     Chaperone present during exam  CONSTITUTIONAL: Well-developed, well-nourished female in no acute distress.  HENT:  Normocephalic, atraumatic, External right and left ear normal. Oropharynx is clear and moist EYES: Conjunctivae and EOM are normal. Pupils are equal, round, and reactive to light. No scleral icterus.  NECK: Normal range of motion, supple, no masses.  Normal thyroid.   CARDIOVASCULAR: Normal heart rate noted, regular rhythm RESPIRATORY: Clear to auscultation bilaterally. Effort and breath sounds normal, no problems with respiration noted. BREASTS: Symmetric in size. No masses, skin changes, nipple drainage, or lymphadenopathy. ABDOMEN: Soft, normal bowel sounds, no distention noted.  No tenderness, rebound or guarding.  PELVIC: Normal appearing external  genitalia; normal appearing vaginal mucosa and cervix.  No abnormal discharge noted. Uterus surgically abscent MUSCULOSKELETAL: Normal range of motion. No tenderness.  No cyanosis, clubbing, or edema.  2+ distal pulses. SKIN: Skin is warm and dry. No rash noted. Not diaphoretic. No erythema. No pallor. NEUROLOGIC: Alert and oriented to person, place, and time. Normal reflexes, muscle tone coordination. No cranial nerve deficit noted. PSYCHIATRIC: Normal mood and affect. Normal behavior. Normal judgment and thought content.  Assessment:  Annual gynecologic examination with pap smear   Plan:  1. Well Woman Exam Will follow up results of pap smear and manage accordingly. Mammogram scheduled - Ambulatory referral to Gastroenterology - MM DIGITAL SCREENING BILATERAL; Future - Cytology - PAP( [place])  2. Primary hypertension  3. Colon cancer  screening - Ambulatory referral to Gastroenterology   Routine preventative health maintenance measures emphasized. Please refer to After Visit Summary for other counseling recommendations.    Loma Boston, [place] for [employer]

## 2020-05-14 ENCOUNTER — Telehealth (INDEPENDENT_AMBULATORY_CARE_PROVIDER_SITE_OTHER): Payer: BC Managed Care – PPO | Admitting: Medical

## 2020-05-14 DIAGNOSIS — J301 Allergic rhinitis due to pollen: Secondary | ICD-10-CM | POA: Diagnosis not present

## 2020-05-14 MED ORDER — METHYLPREDNISOLONE 4 MG PO TABS: ORAL_TABLET | ORAL | 0 refills | Status: DC

## 2020-05-14 MED ORDER — FLUTICASONE PROPIONATE 50 MCG/ACT NA SUSP: 2.0000 | Freq: Every day | NASAL | 1 refills | Status: DC

## 2020-05-14 MED ORDER — MONTELUKAST SODIUM 10 MG PO TABS: 10.0000 mg | ORAL_TABLET | Freq: Every day | ORAL | 3 refills | Status: DC

## 2020-05-14 NOTE — Progress Notes (Signed)
Subjective:    Patient ID: Christina Leblanc, female    DOB: June 25, 1970, 50 y.o.   MRN: [id number]  HPI  Virtual Visit via Video Note  I connected with Christina Leblanc on 05/14/20 at  8:40 AM EDT by a video enabled telemedicine application and verified that I am speaking with the correct person using two identifiers.  Location: Patient: at school. [place]. Provider: home  Pt did not check her vitals.     I discussed the limitations of evaluation and management by telemedicine and the availability of in person appointments. The patient expressed understanding and agreed to proceed.  History of Present Illness:   Pt states has runny nose, nasal congestion and post nasal drainage. No eye itching but was watery early on and little puffy.  No fever, no chills, no sweats, no change in smell or bodyaches. She took covid test and was negative. Saturday before easter on Easter egg hunt symptoms came on. She was not taking allergy meds at that tie.  Pt did start both astelin and xyzal after her symptoms started. It did help but some of symptoms  Pt last a1c was 6.6.     Observations/Objective: General-no acute distress, pleasant, oriented. Lungs- on inspection lungs appear unlabored. Neck- no tracheal deviation or jvd on inspection. Neuro- gross motor function appears intact.   Assessment and Plan: Allergic rhinitis flare signs/symptoms. Continue xyzal and astelin. Add montelukast and flonase.  If with the above signs and symptoms persist or worsen add medrol taper dose.  If medrol used then make sure check sugars daily and notify us of any sugars over 200.  Follow up 7-10 days or as needed.  If not improving then can try to arrange in office visit as that may be necessary as virtual visits at times make exact diagnosis difficult.  Mackie Pai, PA-C  Follow Up Instructions:    I discussed the assessment and treatment plan with the patient. The patient was  provided an opportunity to ask questions and all were answered. The patient agreed with the plan and demonstrated an understanding of the instructions.   The patient was advised to call back or seek an in-person evaluation if the symptoms worsen or if the condition fails to improve as anticipated.  Time spent with patient today was 22  minutes which consisted of chart revdiew, discussing diagnosis, work up treatment and documentation.   Mackie Pai, PA-C   Review of Systems  Constitutional: Negative for chills, diaphoresis and fever.  HENT: Positive for congestion, postnasal drip, rhinorrhea and sneezing. Negative for sinus pressure and sinus pain.   Eyes:       Watery early on.  Respiratory: Negative for cough, choking, shortness of breath and wheezing.   Cardiovascular: Negative for chest pain and palpitations.  Gastrointestinal: Negative for abdominal pain.  Musculoskeletal: Negative for back pain.  Hematological: Negative for adenopathy. Does not bruise/bleed easily.  Psychiatric/Behavioral: Negative for behavioral problems and confusion.     Past Medical History:  Diagnosis Date  . Allergy   . Hyperlipidemia   . Hypertension      Social History   Socioeconomic History  . Marital status: Divorced    Spouse name: Not on file  . Number of children: Not on file  . Years of education: Not on file  . Highest education level: Not on file  Occupational History  . Not on file  Tobacco Use  . Smoking status: Never Smoker  . Smokeless tobacco: Never Used  Substance and Sexual Activity  . Alcohol use: Yes    Alcohol/week: 0.0 standard drinks    Comment: wine occasionally .Twice.  . Drug use: No  . Sexual activity: Not Currently    Birth control/protection: Surgical  Other Topics Concern  . Not on file  Social History Narrative  . Not on file   Social Determinants of Health   Financial Resource Strain: Not on file  Food Insecurity: Not on file  Transportation  Needs: Not on file  Physical Activity: Not on file  Stress: Not on file  Social Connections: Not on file  Intimate Partner Violence: Not on file    Past Surgical History:  Procedure Laterality Date  . ABDOMINAL HYSTERECTOMY     2003    Family History  Problem Relation Age of Onset  . Diabetes Father   . Heart disease Father   . Diabetes Mother   . Cancer Brother   . Hypertension Brother     Allergies  Allergen Reactions  . Claritin [Loratadine] Palpitations    Current Outpatient Medications on File Prior to Visit  Medication Sig Dispense Refill  . acetaminophen (TYLENOL) 500 MG tablet Take 500 mg by mouth every 6 (six) hours as needed.    Marland Kitchen amLODipine (NORVASC) 5 MG tablet Take 1 tablet (5 mg total) by mouth daily. 90 tablet 3  . Azelastine HCl 137 MCG/SPRAY SOLN USE 2 SPRAY(S) IN EACH NOSTRIL AT BEDTIME AS NEEDED 30 mL 0  . blood glucose meter kit and supplies Check blood sugar daily  (E11.9). 1 each 0  . chlorthalidone (HYGROTON) 25 MG tablet Take 1 tablet (25 mg total) by mouth daily. 90 tablet 3  . cyclobenzaprine (FLEXERIL) 5 MG tablet Take 1 tablet (5 mg total) by mouth at bedtime. 10 tablet 0  . levocetirizine (XYZAL) 5 MG tablet TAKE 1 TABLET BY MOUTH ONCE DAILY IN THE EVENING 90 tablet 0  . metFORMIN (GLUCOPHAGE) 1000 MG tablet Take 1 tablet (1,000 mg total) by mouth 2 (two) times daily with a meal. 180 tablet 3  . metoprolol succinate (TOPROL-XL) 25 MG 24 hr tablet Take 1 tablet (25 mg total) by mouth daily. 90 tablet 3  . rosuvastatin (CRESTOR) 40 MG tablet Take 1 tablet (40 mg total) by mouth daily. 90 tablet 3   No current facility-administered medications on file prior to visit.    There were no vitals taken for this visit.      Objective:   Physical Exam        Assessment & Plan:

## 2020-05-14 NOTE — Patient Instructions (Signed)
Allergic rhinitis flare signs/symptoms. Continue xyzal and astelin. Add montelukast and flonase.  If with the above signs and symptoms persist or worsen add medrol taper dose.  If medrol used then make sure check sugars daily and notify us of any sugars over 200.  Follow up 7-10 days or as needed.  If not improving then can try to arrange in office visit as that may be necessary as virtual visits at times make exact diagnosis difficult.

## 2020-05-15 LAB — CYTOLOGY - PAP
Adequacy: ABSENT
Comment: NEGATIVE
Diagnosis: NEGATIVE
High risk HPV: POSITIVE — AB

## 2020-05-18 ENCOUNTER — Encounter: Payer: Self-pay | Admitting: Family Medicine

## 2020-05-18 DIAGNOSIS — R87810 Cervical high risk human papillomavirus (HPV) DNA test positive: Secondary | ICD-10-CM | POA: Insufficient documentation

## 2020-05-26 ENCOUNTER — Telehealth: Payer: Self-pay

## 2020-05-26 NOTE — Telephone Encounter (Signed)
Pt called requesting Pap smear results. Pt made aware that per Dr. 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 Pt also requests a copy of her results. A copy was mailed to her. Understanding was voiced. Amerie Beaumont l Paola Aleshire, CMA

## 2020-07-06 ENCOUNTER — Encounter: Payer: Self-pay | Admitting: Gastroenterology

## 2020-09-04 ENCOUNTER — Encounter: Payer: BC Managed Care – PPO | Admitting: Gastroenterology

## 2020-09-07 ENCOUNTER — Encounter: Payer: BC Managed Care – PPO | Admitting: Gastroenterology

## 2020-09-07 ENCOUNTER — Encounter: Payer: Self-pay | Admitting: Gastroenterology

## 2020-10-07 ENCOUNTER — Ambulatory Visit (AMBULATORY_SURGERY_CENTER): Payer: BC Managed Care – PPO | Admitting: *Deleted

## 2020-10-07 ENCOUNTER — Other Ambulatory Visit: Payer: Self-pay

## 2020-10-07 ENCOUNTER — Encounter: Payer: Self-pay | Admitting: Gastroenterology

## 2020-10-07 VITALS — Ht 66.0 in | Wt 210.0 lb

## 2020-10-07 DIAGNOSIS — Z1211 Encounter for screening for malignant neoplasm of colon: Secondary | ICD-10-CM

## 2020-10-07 MED ORDER — CLENPIQ 10-3.5-12 MG-GM -GM/160ML PO SOLN: 1.0000 | Freq: Once | ORAL | 0 refills | Status: AC

## 2020-10-07 NOTE — Progress Notes (Signed)
Virtual pre-visit completed over telephone. Instructions e-mailed and forwarded through [place].    No egg or soy allergy known to patient  No issues known to pt with past sedation with any surgeries or procedures Patient denies ever being told they had issues or difficulty with intubation  No FH of Malignant Hyperthermia Pt is not on diet pills Pt is not on  home 02  Pt is not on blood thinners  Pt denies issues with constipation  No A fib or A flutter  EMMI video to pt or via [place] 19 guidelines implemented in [place] today with Pt and RN   Pt is fully vaccinated  for [employer] mailed to patient.  Discussed with pt there will be an out-of-pocket cost for prep and that varies from $0 to 70 +  dollars   Due to the COVID-19 pandemic we are asking patients to follow certain guidelines.  Pt aware of COVID protocols and [facility] guidelines

## 2020-10-21 ENCOUNTER — Ambulatory Visit (AMBULATORY_SURGERY_CENTER): Payer: BC Managed Care – PPO | Admitting: Gastroenterology

## 2020-10-21 ENCOUNTER — Encounter: Payer: Self-pay | Admitting: Gastroenterology

## 2020-10-21 ENCOUNTER — Other Ambulatory Visit: Payer: Self-pay

## 2020-10-21 VITALS — BP 130/79 | HR 75 | Temp 98.8°F | Resp 17 | Ht 66.0 in | Wt 210.0 lb

## 2020-10-21 DIAGNOSIS — K635 Polyp of colon: Secondary | ICD-10-CM

## 2020-10-21 DIAGNOSIS — K5730 Diverticulosis of large intestine without perforation or abscess without bleeding: Secondary | ICD-10-CM

## 2020-10-21 DIAGNOSIS — K640 First degree hemorrhoids: Secondary | ICD-10-CM

## 2020-10-21 DIAGNOSIS — Z1211 Encounter for screening for malignant neoplasm of colon: Secondary | ICD-10-CM

## 2020-10-21 DIAGNOSIS — D125 Benign neoplasm of sigmoid colon: Secondary | ICD-10-CM

## 2020-10-21 MED ORDER — SODIUM CHLORIDE 0.9 % IV SOLN: 500.0000 mL | Freq: Once | INTRAVENOUS | Status: DC

## 2020-10-21 NOTE — Progress Notes (Signed)
GASTROENTEROLOGY PROCEDURE H&P NOTE   Primary Care Physician: Mackie Pai, PA-C    Reason for Procedure:  Colon Cancer screening  Plan:    Colonoscopy  Patient is appropriate for endoscopic procedure(s) in the ambulatory ([place]) setting.  The nature of the procedure, as well as the risks, benefits, and alternatives were carefully and thoroughly reviewed with the patient. Ample time for discussion and questions allowed. The patient understood, was satisfied, and agreed to proceed.     HPI: Christina Leblanc is a 50 y.o. female who presents for colonoscopy for routine Colon Cancer screening.  No active GI symptoms.  No known family history of colon cancer or related malignancy.  Patient is otherwise without complaints or active issues today.  Past Medical History:  Diagnosis Date   Allergy    Diabetes mellitus without complication ([place])    Hyperlipidemia    Hypertension     Past Surgical History:  Procedure Laterality Date   ABDOMINAL HYSTERECTOMY     2003   MYOMECTOMY ABDOMINAL APPROACH      Prior to Admission medications   Medication Sig Start Date End Date Taking? Authorizing Provider  amLODipine (NORVASC) 5 MG tablet Take 1 tablet (5 mg total) by mouth daily. 10/22/19  Yes Saguier, Percell Miller, PA-C  blood glucose meter kit and supplies Check blood sugar daily  (E11.9). 06/01/18  Yes Saguier, Percell Miller, PA-C  chlorthalidone (HYGROTON) 25 MG tablet Take 1 tablet (25 mg total) by mouth daily. 10/22/19  Yes Saguier, Percell Miller, PA-C  metFORMIN (GLUCOPHAGE) 1000 MG tablet Take 1 tablet (1,000 mg total) by mouth 2 (two) times daily with a meal. 10/22/19  Yes Saguier, Percell Miller, PA-C  metoprolol succinate (TOPROL-XL) 25 MG 24 hr tablet Take 1 tablet (25 mg total) by mouth daily. 10/22/19  Yes Saguier, Percell Miller, PA-C  rosuvastatin (CRESTOR) 40 MG tablet Take 1 tablet (40 mg total) by mouth daily. 10/24/19  Yes Saguier, Percell Miller, PA-C  acetaminophen (TYLENOL) 500 MG tablet Take 500 mg by mouth  every 6 (six) hours as needed.    [provider]  Azelastine HCl 137 MCG/SPRAY SOLN USE 2 SPRAY(S) IN EACH NOSTRIL AT BEDTIME AS NEEDED 05/06/20   Saguier, Percell Miller, PA-C  cyclobenzaprine (FLEXERIL) 5 MG tablet Take 1 tablet (5 mg total) by mouth at bedtime. 03/03/16   Saguier, Percell Miller, PA-C  fluticasone (FLONASE) 50 MCG/ACT nasal spray Place 2 sprays into both nostrils daily. 05/14/20   Saguier, Percell Miller, PA-C  levocetirizine (XYZAL) 5 MG tablet TAKE 1 TABLET BY MOUTH ONCE DAILY IN THE EVENING 05/06/20   Saguier, Percell Miller, PA-C    Current Outpatient Medications  Medication Sig Dispense Refill   amLODipine (NORVASC) 5 MG tablet Take 1 tablet (5 mg total) by mouth daily. 90 tablet 3   blood glucose meter kit and supplies Check blood sugar daily  (E11.9). 1 each 0   chlorthalidone (HYGROTON) 25 MG tablet Take 1 tablet (25 mg total) by mouth daily. 90 tablet 3   metFORMIN (GLUCOPHAGE) 1000 MG tablet Take 1 tablet (1,000 mg total) by mouth 2 (two) times daily with a meal. 180 tablet 3   metoprolol succinate (TOPROL-XL) 25 MG 24 hr tablet Take 1 tablet (25 mg total) by mouth daily. 90 tablet 3   rosuvastatin (CRESTOR) 40 MG tablet Take 1 tablet (40 mg total) by mouth daily. 90 tablet 3   acetaminophen (TYLENOL) 500 MG tablet Take 500 mg by mouth every 6 (six) hours as needed.     Azelastine HCl 137 MCG/SPRAY SOLN  USE 2 SPRAY(S) IN EACH NOSTRIL AT BEDTIME AS NEEDED 30 mL 0   cyclobenzaprine (FLEXERIL) 5 MG tablet Take 1 tablet (5 mg total) by mouth at bedtime. 10 tablet 0   fluticasone (FLONASE) 50 MCG/ACT nasal spray Place 2 sprays into both nostrils daily. 16 g 1   levocetirizine (XYZAL) 5 MG tablet TAKE 1 TABLET BY MOUTH ONCE DAILY IN THE EVENING 90 tablet 0   Current Facility-Administered Medications  Medication Dose Route Frequency Provider Last Rate Last Admin   0.9 %  sodium chloride infusion  500 mL Intravenous Once Posey Petrik V, DO        Allergies as of 10/21/2020 - Review Complete  10/21/2020  Allergen Reaction Noted   Claritin [loratadine] Palpitations 11/06/2014    Family History  Problem Relation Age of Onset   Diabetes Mother    Diabetes Father    Heart disease Father    Cancer Brother    Hypertension Brother    Colon polyps Neg Hx    Colon cancer Neg Hx    Esophageal cancer Neg Hx    Stomach cancer Neg Hx    Rectal cancer Neg Hx     Social History   Socioeconomic History   Marital status: Divorced    Spouse name: Not on file   Number of children: Not on file   Years of education: Not on file   Highest education level: Not on file  Occupational History   Not on file  Tobacco Use   Smoking status: Never   Smokeless tobacco: Never  Vaping Use   Vaping Use: Never used  Substance and Sexual Activity   Alcohol use: Yes    Alcohol/week: 0.0 standard drinks    Comment: wine occasionally .Twice.   Drug use: No   Sexual activity: Not Currently    Birth control/protection: Surgical  Other Topics Concern   Not on file  Social History Narrative   Not on file   Social Determinants of Health   Financial Resource Strain: Not on file  Food Insecurity: Not on file  Transportation Needs: Not on file  Physical Activity: Not on file  Stress: Not on file  Social Connections: Not on file  Intimate Partner Violence: Not on file    Physical Exam: Vital signs in last 24 hours: $RemoveB'@BP'laMtmgEt$  (!) 158/98   Pulse 82   Temp 98.8 F (37.1 C) (Temporal)   Ht $R'5\' 6"'mk$  (1.676 m)   Wt 210 lb (95.3 kg)   SpO2 98%   BMI 33.89 kg/m  GEN: NAD EYE: Sclerae anicteric ENT: MMM CV: Non-tachycardic Pulm: CTA b/l GI: Soft, NT/ND NEURO:  Alert & Oriented x 3   Gerrit Heck, DO [facility]   10/21/2020 9:34 AM

## 2020-10-21 NOTE — Patient Instructions (Signed)
Please read handouts provided. Continue present medications. Await pathology results. Return to GI clinic as needed. Consider a fiber supplement.   YOU HAD AN ENDOSCOPIC PROCEDURE TODAY AT [facility]:   Refer to the procedure report that was given to you for any specific questions about what was found during the examination.  If the procedure report does not answer your questions, please call your gastroenterologist to clarify.  If you requested that your care partner not be given the details of your procedure findings, then the procedure report has been included in a sealed envelope for you to review at your convenience later.  YOU SHOULD EXPECT: Some feelings of bloating in the abdomen. Passage of more gas than usual.  Walking can help get rid of the air that was put into your GI tract during the procedure and reduce the bloating. If you had a lower endoscopy (such as a colonoscopy or flexible sigmoidoscopy) you may notice spotting of blood in your stool or on the toilet paper. If you underwent a bowel prep for your procedure, you may not have a normal bowel movement for a few days.  Please Note:  You might notice some irritation and congestion in your nose or some drainage.  This is from the oxygen used during your procedure.  There is no need for concern and it should clear up in a day or so.  SYMPTOMS TO REPORT IMMEDIATELY:  Following lower endoscopy (colonoscopy or flexible sigmoidoscopy):  Excessive amounts of blood in the stool  Significant tenderness or worsening of abdominal pains  Swelling of the abdomen that is new, acute  Fever of 100F or higher    For urgent or emergent issues, a gastroenterologist can be reached at any hour by calling [phone]. Do not use MyChart messaging for urgent concerns.    DIET:  We do recommend a small meal at first, but then you may proceed to your regular diet.  Drink plenty of fluids but you should avoid alcoholic  beverages for 24 hours.  ACTIVITY:  You should plan to take it easy for the rest of today and you should NOT DRIVE or use heavy machinery until tomorrow (because of the sedation medicines used during the test).    FOLLOW UP: Our staff will call the number listed on your records 48-72 hours following your procedure to check on you and address any questions or concerns that you may have regarding the information given to you following your procedure. If we do not reach you, we will leave a message.  We will attempt to reach you two times.  During this call, we will ask if you have developed any symptoms of COVID 19. If you develop any symptoms (ie: fever, flu-like symptoms, shortness of breath, cough etc.) before then, please call [phone].  If you test positive for Covid 19 in the 2 weeks post procedure, please call and report this information to [place].    If any biopsies were taken you will be contacted by phone or by letter within the next 1-3 weeks.  Please call us at [phone] if you have not heard about the biopsies in 3 weeks.    SIGNATURES/CONFIDENTIALITY: You and/or your care partner have signed paperwork which will be entered into your electronic medical record.  These signatures attest to the fact that that the information above on your After Visit Summary has been reviewed and is understood.  Full responsibility of the confidentiality of this discharge information lies with  you and/or your care-partner.

## 2020-10-21 NOTE — Op Note (Signed)
[place] Patient Name: Christina Leblanc Procedure Date: 10/21/2020 9:31 AM MRN: [id number] Endoscopist: Gerrit Heck , MD Age: 50 Referring MD:  Date of Birth: [birth year] Gender: Female Account #: [id number] Procedure:                Colonoscopy Indications:              Screening for colorectal malignant neoplasm, This                            is the patient's first colonoscopy Medicines:                Monitored Anesthesia Care Procedure:                Pre-Anesthesia Assessment:                           - Prior to the procedure, a History and Physical                            was performed, and patient medications and                            allergies were reviewed. The patient's tolerance of                            previous anesthesia was also reviewed. The risks                            and benefits of the procedure and the sedation                            options and risks were discussed with the patient.                            All questions were answered, and informed consent                            was obtained. Prior Anticoagulants: The patient has                            taken no previous anticoagulant or antiplatelet                            agents. ASA Grade Assessment: II - A patient with                            mild systemic disease. After reviewing the risks                            and benefits, the patient was deemed in                            satisfactory condition to undergo the procedure.  After obtaining informed consent, the colonoscope                            was passed under direct vision. Throughout the                            procedure, the patient's blood pressure, pulse, and                            oxygen saturations were monitored continuously. The                            CF [id number] #[id number] was introduced through the anus                            and advanced to the  the cecum, identified by                            appendiceal orifice and ileocecal valve. The                            colonoscopy was performed without difficulty. The                            patient tolerated the procedure well. The quality                            of the bowel preparation was good. The ileocecal                            valve, appendiceal orifice, and rectum were                            photographed. Scope In: 9:46:32 AM Scope Out: 10:06:20 AM Scope Withdrawal Time: 0 hours 15 minutes 45 seconds  Total Procedure Duration: 0 hours 19 minutes 48 seconds  Findings:                 The perianal and digital rectal examinations were                            normal.                           Three sessile polyps were found in the sigmoid                            colon. The polyps were 2 to 3 mm in size. These                            polyps were removed with a cold biopsy forceps.                            Resection and retrieval were complete. Estimated  blood loss was minimal.                           Multiple small and large-mouthed diverticula were                            found in the sigmoid colon, transverse colon and                            ascending colon.                           Non-bleeding internal hemorrhoids were found during                            retroflexion. The hemorrhoids were small. Complications:            No immediate complications. Estimated Blood Loss:     Estimated blood loss was minimal. Impression:               - Three 2 to 3 mm polyps in the sigmoid colon,                            removed with a cold biopsy forceps. Resected and                            retrieved.                           - Diverticulosis in the sigmoid colon, in the                            transverse colon and in the ascending colon.                           - Non-bleeding internal  hemorrhoids. Recommendation:           - Patient has a contact number available for                            emergencies. The signs and symptoms of potential                            delayed complications were discussed with the                            patient. Return to normal activities tomorrow.                            Written discharge instructions were provided to the                            patient.                           - Resume previous diet.                           -  Continue present medications.                           - Await pathology results.                           - Repeat colonoscopy for surveillance based on                            pathology results.                           - Return to GI clinic PRN.                           - Use fiber, for example Citrucel, Fibercon, Konsyl                            or Metamucil.                           - Internal hemorrhoids were noted on this study and                            may be amenable to hemorrhoid band ligation. If you                            are interested in further treatment of these                            hemorrhoids with band ligation, please contact my                            clinic to set up an appointment for evaluation and                            treatment. Gerrit Heck, MD 10/21/2020 10:10:43 AM

## 2020-10-21 NOTE — Progress Notes (Signed)
To PACU, VSS. Report to RN.tb 

## 2020-10-21 NOTE — Progress Notes (Signed)
Called to room to assist during endoscopic procedure.  Patient ID and intended procedure confirmed with present staff. Received instructions for my participation in the procedure from the performing physician.  

## 2020-10-21 NOTE — Progress Notes (Signed)
VS completed by [place].   Pt's states no medical or surgical changes since previsit or office visit.  

## 2020-10-23 ENCOUNTER — Telehealth: Payer: Self-pay | Admitting: *Deleted

## 2020-10-23 NOTE — Telephone Encounter (Signed)
  Follow up Call-  Call back number 10/21/2020  Post procedure Call Back phone  # [phone]  Permission to leave phone message Yes  Some recent data might be hidden     Patient questions:  Do you have a fever, pain , or abdominal swelling? No. Pain Score  0 *  Have you tolerated food without any problems? Yes.    Have you been able to return to your normal activities? Yes.    Do you have any questions about your discharge instructions: Diet   No. Medications  No. Follow up visit  No.  Do you have questions or concerns about your Care? No.  Actions: * If pain score is 4 or above: No action needed, pain <4.Have you developed a fever since your procedure? no  2.   Have you had an respiratory symptoms (SOB or cough) since your procedure? no  3.   Have you tested positive for COVID 19 since your procedure no  4.   Have you had any family members/close contacts diagnosed with the COVID 19 since your procedure?  no   If yes to any of these questions please route to Joylene John, RN and Joella Prince, RN

## 2020-10-27 ENCOUNTER — Other Ambulatory Visit: Payer: Self-pay | Admitting: Medical

## 2020-11-03 ENCOUNTER — Encounter: Payer: Self-pay | Admitting: Gastroenterology

## 2020-11-04 ENCOUNTER — Other Ambulatory Visit: Payer: Self-pay | Admitting: Medical

## 2020-11-09 ENCOUNTER — Ambulatory Visit (HOSPITAL_BASED_OUTPATIENT_CLINIC_OR_DEPARTMENT_OTHER)
Admission: RE | Admit: 2020-11-09 | Discharge: 2020-11-09 | Disposition: A | Discharge status: Home / Self Care | Payer: BC Managed Care – PPO | Source: Ambulatory Visit | Attending: Family Medicine | Admitting: Family Medicine

## 2020-11-09 ENCOUNTER — Encounter (HOSPITAL_BASED_OUTPATIENT_CLINIC_OR_DEPARTMENT_OTHER): Payer: Self-pay

## 2020-11-09 ENCOUNTER — Other Ambulatory Visit: Payer: Self-pay

## 2020-11-09 DIAGNOSIS — Z01419 Encounter for gynecological examination (general) (routine) without abnormal findings: Secondary | ICD-10-CM | POA: Diagnosis present

## 2020-11-09 DIAGNOSIS — Z1231 Encounter for screening mammogram for malignant neoplasm of breast: Secondary | ICD-10-CM | POA: Insufficient documentation

## 2020-11-21 ENCOUNTER — Other Ambulatory Visit: Payer: Self-pay | Admitting: Medical

## 2020-12-13 ENCOUNTER — Other Ambulatory Visit: Payer: Self-pay | Admitting: Medical

## 2020-12-14 ENCOUNTER — Telehealth: Payer: Self-pay | Admitting: Medical

## 2020-12-14 ENCOUNTER — Other Ambulatory Visit: Payer: Self-pay | Admitting: Medical

## 2020-12-14 MED ORDER — METFORMIN HCL 1000 MG PO TABS: 1000.0000 mg | ORAL_TABLET | Freq: Two times a day (BID) | ORAL | 3 refills | Status: DC

## 2020-12-14 MED ORDER — METOPROLOL SUCCINATE ER 25 MG PO TB24: 25.0000 mg | ORAL_TABLET | Freq: Every day | ORAL | 0 refills | Status: DC

## 2020-12-14 NOTE — Telephone Encounter (Signed)
Medication:  1.metoprolol succinate (TOPROL-XL) 25 MG 24 hr tablet 2.metFORMIN (GLUCOPHAGE) 1000 MG tablet   Has the patient contacted their pharmacy? Yes.   (If no, request that the patient contact the pharmacy for the refill.) (If yes, when and what did the pharmacy advise?)  Pharmacy states she does not have any refills   Preferred Pharmacy (with phone number or street name):  [facility], [place] [address]  Phone:  [phone]  Fax:  [phone]    Agent: Please be advised that RX refills may take up to 3 business days. We ask that you follow-up with your pharmacy.

## 2020-12-14 NOTE — Telephone Encounter (Signed)
Ok to refill last seen in 2021, pt has an appt next week

## 2020-12-14 NOTE — Telephone Encounter (Signed)
Chart opened to rx med, order lab, review chart, respond to my chart message or send message to staff member  

## 2020-12-23 ENCOUNTER — Other Ambulatory Visit: Payer: Self-pay | Admitting: Medical

## 2020-12-23 ENCOUNTER — Telehealth: Payer: Self-pay | Admitting: Medical

## 2020-12-23 NOTE — Telephone Encounter (Signed)
Medication:  rosuvastatin (CRESTOR) 40 MG tablet Has the patient contacted their pharmacy? No. (If no, request that the patient contact the pharmacy for the refill.) (If yes, when and what did the pharmacy advise?)  Preferred Pharmacy (with phone number or street name): [facility], [place] [address]  Phone:  [phone]  Fax:  [phone]   Agent: Please be advised that RX refills may take up to 3 business days. We ask that you follow-up with your pharmacy.

## 2020-12-24 MED ORDER — ROSUVASTATIN CALCIUM 40 MG PO TABS: 40.0000 mg | ORAL_TABLET | Freq: Every day | ORAL | 0 refills | Status: DC

## 2020-12-24 NOTE — Telephone Encounter (Signed)
Rx sent 

## 2020-12-25 ENCOUNTER — Encounter: Payer: BC Managed Care – PPO | Admitting: Medical

## 2021-01-01 ENCOUNTER — Encounter: Payer: BC Managed Care – PPO | Admitting: Medical

## 2021-01-05 ENCOUNTER — Ambulatory Visit (INDEPENDENT_AMBULATORY_CARE_PROVIDER_SITE_OTHER): Payer: BC Managed Care – PPO | Admitting: Medical

## 2021-01-05 ENCOUNTER — Telehealth: Payer: Self-pay | Admitting: Medical

## 2021-01-05 ENCOUNTER — Encounter: Payer: Self-pay | Admitting: Medical

## 2021-01-05 VITALS — BP 135/80 | HR 84 | Temp 98.3°F | Resp 18 | Ht 66.0 in | Wt 230.0 lb

## 2021-01-05 DIAGNOSIS — E119 Type 2 diabetes mellitus without complications: Secondary | ICD-10-CM

## 2021-01-05 DIAGNOSIS — I10 Essential (primary) hypertension: Secondary | ICD-10-CM | POA: Diagnosis not present

## 2021-01-05 DIAGNOSIS — L918 Other hypertrophic disorders of the skin: Secondary | ICD-10-CM | POA: Diagnosis not present

## 2021-01-05 DIAGNOSIS — E785 Hyperlipidemia, unspecified: Secondary | ICD-10-CM

## 2021-01-05 DIAGNOSIS — Z0000 Encounter for general adult medical examination without abnormal findings: Secondary | ICD-10-CM | POA: Diagnosis not present

## 2021-01-05 LAB — CBC WITH DIFFERENTIAL/PLATELET
Basophils Absolute: 0 10*3/uL (ref 0.0–0.1)
Basophils Relative: 0.5 % (ref 0.0–3.0)
Eosinophils Absolute: 0.1 10*3/uL (ref 0.0–0.7)
Eosinophils Relative: 1.2 % (ref 0.0–5.0)
HCT: 42.1 % (ref 36.0–46.0)
Hemoglobin: 13.3 g/dL (ref 12.0–15.0)
Lymphocytes Relative: 40.7 % (ref 12.0–46.0)
Lymphs Abs: 2 10*3/uL (ref 0.7–4.0)
MCHC: 31.5 g/dL (ref 30.0–36.0)
MCV: 82.7 fl (ref 78.0–100.0)
Monocytes Absolute: 0.4 10*3/uL (ref 0.1–1.0)
Monocytes Relative: 7.4 % (ref 3.0–12.0)
Neutro Abs: 2.4 10*3/uL (ref 1.4–7.7)
Neutrophils Relative %: 50.2 % (ref 43.0–77.0)
Platelets: 337 10*3/uL (ref 150.0–400.0)
RBC: 5.1 Mil/uL (ref 3.87–5.11)
RDW: 16.5 % — ABNORMAL HIGH (ref 11.5–15.5)
WBC: 4.9 10*3/uL (ref 4.0–10.5)

## 2021-01-05 LAB — COMPREHENSIVE METABOLIC PANEL
ALT: 26 U/L (ref 0–35)
AST: 29 U/L (ref 0–37)
Albumin: 4.5 g/dL (ref 3.5–5.2)
Alkaline Phosphatase: 68 U/L (ref 39–117)
BUN: 8 mg/dL (ref 6–23)
CO2: 33 mEq/L — ABNORMAL HIGH (ref 19–32)
Calcium: 10 mg/dL (ref 8.4–10.5)
Chloride: 99 mEq/L (ref 96–112)
Creatinine, Ser: 0.78 mg/dL (ref 0.40–1.20)
GFR: 87.79 mL/min (ref >60.00)
Glucose, Bld: 92 mg/dL (ref 70–99)
Potassium: 4 mEq/L (ref 3.5–5.1)
Sodium: 139 mEq/L (ref 135–145)
Total Bilirubin: 0.6 mg/dL (ref 0.2–1.2)
Total Protein: 7.8 g/dL (ref 6.0–8.3)

## 2021-01-05 LAB — HEMOGLOBIN A1C: Hgb A1c MFr Bld: 7.1 % — ABNORMAL HIGH (ref 4.6–6.5)

## 2021-01-05 LAB — LIPID PANEL
Cholesterol: 180 mg/dL (ref 0–200)
HDL: 51.3 mg/dL (ref >39.00)
LDL Cholesterol: 96 mg/dL (ref 0–99)
NonHDL: 128.46
Total CHOL/HDL Ratio: 4
Triglycerides: 161 mg/dL — ABNORMAL HIGH (ref 0.0–149.0)
VLDL: 32.2 mg/dL (ref 0.0–40.0)

## 2021-01-05 MED ORDER — FAMOTIDINE 20 MG PO TABS: 20.0000 mg | ORAL_TABLET | Freq: Every day | ORAL | 2 refills | Status: AC

## 2021-01-05 MED ORDER — LINAGLIPTIN 5 MG PO TABS: 5.0000 mg | ORAL_TABLET | Freq: Every day | ORAL | 3 refills | Status: DC

## 2021-01-05 NOTE — Progress Notes (Signed)
° °Subjective:  ° ° Patient ID: Christina Leblanc, female    DOB: 06/17/1970, 50 y.o.   MRN: [id number] ° °HPI ° °Pt in for wellness exam. ° °Pt still working at [employer]. Pt recently not exercising but has plans to restart. Admits not eating very healthy. But has stopped drinking sodas. Nonsmoker. No alcohol.  ° °Up to date on colonoscopy. °Pt up to date on pap. ° °Pt had 5 covid vaccines.  °Defers pneumonia vaccine. °Pt will check with insurance if shingrix covered. ° °Pt will schedule appt with [facility]. ° °Gerd- pt stated after eating gets tickle in throat. Certain spicy foods. Late at night.  ° °Htn- on metoprolol and chorthalidone.  ° °Hyperlipidemia- on crestor. ° ° ° ° °Review of Systems  °Constitutional:  Negative for chills, fatigue and fever.  °HENT:  Negative for congestion, ear discharge and ear pain.   °Respiratory:  Negative for cough, chest tightness, shortness of breath and wheezing.   °Cardiovascular:  Negative for chest pain and palpitations.  °Gastrointestinal:  Negative for abdominal pain, blood in stool, constipation, diarrhea and rectal pain.  °Musculoskeletal:  Negative for back pain, joint swelling, myalgias and neck pain.  °Skin:  Negative for rash.  °Neurological:  Negative for dizziness, tremors, speech difficulty, weakness, numbness and headaches.  °Hematological:  Negative for adenopathy. Does not bruise/bleed easily.  °Psychiatric/Behavioral:  Negative for decreased concentration.   ° ° °Past Medical History:  °Diagnosis Date  ° Allergy   ° Diabetes mellitus without complication ([facility])   ° Hyperlipidemia   ° Hypertension   ° °  °Social History  ° °Socioeconomic History  ° Marital status: Divorced  °  Spouse name: Not on file  ° Number of children: Not on file  ° Years of education: Not on file  ° Highest education level: Not on file  °Occupational History  ° Not on file  °Tobacco Use  ° Smoking status: Never  ° Smokeless tobacco: Never  °Vaping Use  ° Vaping Use: Never used   °Substance and Sexual Activity  ° Alcohol use: Yes  °  Alcohol/week: 0.0 standard drinks  °  Comment: wine occasionally .Twice.  ° Drug use: No  ° Sexual activity: Not Currently  °  Birth control/protection: Surgical  °Other Topics Concern  ° Not on file  °Social History Narrative  ° Not on file  ° °Social Determinants of Health  ° °Financial Resource Strain: Not on file  °Food Insecurity: Not on file  °Transportation Needs: Not on file  °Physical Activity: Not on file  °Stress: Not on file  °Social Connections: Not on file  °Intimate Partner Violence: Not on file  ° ° °Past Surgical History:  °Procedure Laterality Date  ° ABDOMINAL HYSTERECTOMY    ° 2003  ° MYOMECTOMY ABDOMINAL APPROACH    ° ° °Family History  °Problem Relation Age of Onset  ° Diabetes Mother   ° Diabetes Father   ° Heart disease Father   ° Cancer Brother   ° Hypertension Brother   ° Colon polyps Neg Hx   ° Colon cancer Neg Hx   ° Esophageal cancer Neg Hx   ° Stomach cancer Neg Hx   ° Rectal cancer Neg Hx   ° ° °Allergies  °Allergen Reactions  ° Claritin [Loratadine] Palpitations  ° ° °Current Outpatient Medications on File Prior to Visit  °Medication Sig Dispense Refill  ° acetaminophen (TYLENOL) 500 MG tablet Take 500 mg by mouth every 6 (six)   hours as needed.    ° amLODipine (NORVASC) 5 MG tablet Take 1 tablet by mouth once daily 90 tablet 0  ° Azelastine HCl 137 MCG/SPRAY SOLN USE 2 SPRAY(S) IN EACH NOSTRIL AT BEDTIME AS NEEDED 30 mL 0  ° blood glucose meter kit and supplies Check blood sugar daily  (E11.9). 1 each 0  ° chlorthalidone (HYGROTON) 25 MG tablet Take 1 tablet (25 mg total) by mouth daily. 90 tablet 3  ° cyclobenzaprine (FLEXERIL) 5 MG tablet Take 1 tablet (5 mg total) by mouth at bedtime. 10 tablet 0  ° fluticasone (FLONASE) 50 MCG/ACT nasal spray Place 2 sprays into both nostrils daily. 16 g 1  ° levocetirizine (XYZAL) 5 MG tablet TAKE 1 TABLET BY MOUTH ONCE DAILY IN THE EVENING 90 tablet 0  ° metFORMIN (GLUCOPHAGE) 1000 MG  tablet Take 1 tablet (1,000 mg total) by mouth 2 (two) times daily with a meal. 180 tablet 3  ° metoprolol succinate (TOPROL-XL) 25 MG 24 hr tablet Take 1 tablet (25 mg total) by mouth daily. 30 tablet 0  ° rosuvastatin (CRESTOR) 40 MG tablet Take 1 tablet (40 mg total) by mouth daily. 30 tablet 0  ° °No current facility-administered medications on file prior to visit.  ° ° °BP (!) 160/85    Pulse 84    Temp 98.3 °F (36.8 °C)    Resp 18    Ht 5' 6" (1.676 m)    Wt 230 lb (104.3 kg)    SpO2 98%    BMI 37.12 kg/m²  °  °   °Objective:  ° Physical Exam ° °General °Mental Status- Alert. General Appearance- Not in acute distress.  ° °Skin °General: Color- Normal Color. Moisture- Normal Moisture. ° °Neck °Carotid Arteries- Normal color. Moisture- Normal Moisture. No carotid bruits. No JVD. ° °Chest and Lung Exam °Auscultation: °Breath Sounds:-Normal. ° °Cardiovascular °Auscultation:Rythm- Regular. °Murmurs & Other Heart Sounds:Auscultation of the heart reveals- No Murmurs. ° °Abdomen °Inspection:-Inspeection Normal. °Palpation/Percussion:Note:No mass. Palpation and Percussion of the abdomen reveal- Non Tender, Non Distended + BS, no rebound or guarding. ° ° ° °Neurologic °Cranial Nerve exam:- CN III-XII intact(No nystagmus), symmetric smile. °Drift Test:- No drift. °Romberg Exam:- Negative.  °Heal to Toe Gait exam:-Normal. °Finger to Nose:- Normal/Intact °Strength:- 5/5 equal and symmetric strength both upper and lower extremities.  ° ° °   °Assessment & Plan:  ° °Patient Instructions  °For you wellness exam today I have ordered cbc, cmp and  lipid panel.  ° °Vaccine given today. Pneumonia vaccine deferred until after holidays.  ° °Recommend exercise and healthy diet. ° °We will let you know lab results as they come in. ° °Follow up date appointment will be determined after lab review.   ° °For diabetes will follow a1c. Continue metformin. ° °For high cholesterol- continue crestor and make changes if lipid level  elevated. ° °For htn- continue toprol and amlodipine. Bp wel controlled. ° °Can schedule for skin tag removal. ° °For gerd rx famotadine. ° °Follow up date to be determined after lab review. ° ° Edward Saguier, PA-C  ° ° °[id number] charge as well in addition to wellness exam. Addressed chronic med problems, htn, diabetes, high cholesterol, recent new gerd and discussed skin tags. °

## 2021-01-05 NOTE — Telephone Encounter (Signed)
Chart opened to rx med, order lab, review chart, respond to my chart message or send message to staff member  

## 2021-01-05 NOTE — Patient Instructions (Addendum)
For you wellness exam today I have ordered cbc, cmp and  lipid panel.   Vaccine given today. Pneumonia vaccine deferred until after holidays.   Recommend exercise and healthy diet.  We will let you know lab results as they come in.  Follow up date appointment will be determined after lab review.    For diabetes will follow a1c. Continue metformin.  For high cholesterol- continue crestor and make changes if lipid level elevated.  For htn- continue toprol and amlodipine. Bp wel controlled.  Can schedule for skin tag removal.  For gerd rx famotadine.  Follow up date to be determined after lab review.   Preventive Care 84-37 Years Old, Female Preventive care refers to lifestyle choices and visits with your health care provider that can promote health and wellness. Preventive care visits are also called wellness exams. What can I expect for my preventive care visit? Counseling Your health care provider may ask you questions about your: Medical history, including: Past medical problems. Family medical history. Pregnancy history. Current health, including: Menstrual cycle. Method of birth control. Emotional well-being. Home life and relationship well-being. Sexual activity and sexual health. Lifestyle, including: Alcohol, nicotine or tobacco, and drug use. Access to firearms. Diet, exercise, and sleep habits. Work and work Statistician. Sunscreen use. Safety issues such as seatbelt and bike helmet use. Physical exam Your health care provider will check your: Height and weight. These may be used to calculate your BMI (body mass index). BMI is a measurement that tells if you are at a healthy weight. Waist circumference. This measures the distance around your waistline. This measurement also tells if you are at a healthy weight and may help predict your risk of certain diseases, such as type 2 diabetes and high blood pressure. Heart rate and blood pressure. Body  temperature. Skin for abnormal spots. What immunizations do I need? Vaccines are usually given at various ages, according to a schedule. Your health care provider will recommend vaccines for you based on your age, medical history, and lifestyle or other factors, such as travel or where you work. What tests do I need? Screening Your health care provider may recommend screening tests for certain conditions. This may include: Lipid and cholesterol levels. Diabetes screening. This is done by checking your blood sugar (glucose) after you have not eaten for a while (fasting). Pelvic exam and Pap test. Hepatitis B test. Hepatitis C test. HIV (human immunodeficiency virus) test. STI (sexually transmitted infection) testing, if you are at risk. Lung cancer screening. Colorectal cancer screening. Mammogram. Talk with your health care provider about when you should start having regular mammograms. This may depend on whether you have a family history of breast cancer. BRCA-related cancer screening. This may be done if you have a family history of breast, ovarian, tubal, or peritoneal cancers. Bone density scan. This is done to screen for osteoporosis. Talk with your health care provider about your test results, treatment options, and if necessary, the need for more tests. Follow these instructions at home: Eating and drinking  Eat a diet that includes fresh fruits and vegetables, whole grains, lean protein, and low-fat dairy products. Take vitamin and mineral supplements as recommended by your health care provider. Do not drink alcohol if: Your health care provider tells you not to drink. You are pregnant, may be pregnant, or are planning to become pregnant. If you drink alcohol: Limit how much you have to 0-1 drink a day. Know how much alcohol is in your drink. In the  [place] one drink equals one 12 oz bottle of beer (355 mL), one 5 oz glass of wine (148 mL), or one 1 oz glass of hard liquor (44  mL). Lifestyle Brush your teeth every morning and night with fluoride toothpaste. Floss one time each day. Exercise for at least 30 minutes 5 or more days each week. Do not use any products that contain nicotine or tobacco. These products include cigarettes, chewing tobacco, and vaping devices, such as e-cigarettes. If you need help quitting, ask your health care provider. Do not use drugs. If you are sexually active, practice safe sex. Use a condom or other form of protection to prevent STIs. If you do not wish to become pregnant, use a form of birth control. If you plan to become pregnant, see your health care provider for a prepregnancy visit. Take aspirin only as told by your health care provider. Make sure that you understand how much to take and what form to take. Work with your health care provider to find out whether it is safe and beneficial for you to take aspirin daily. Find healthy ways to manage stress, such as: Meditation, yoga, or listening to music. Journaling. Talking to a trusted person. Spending time with friends and family. Minimize exposure to UV radiation to reduce your risk of skin cancer. Safety Always wear your seat belt while driving or riding in a vehicle. Do not drive: If you have been drinking alcohol. Do not ride with someone who has been drinking. When you are tired or distracted. While texting. If you have been using any mind-altering substances or drugs. Wear a helmet and other protective equipment during sports activities. If you have firearms in your house, make sure you follow all gun safety procedures. Seek help if you have been physically or sexually abused. What's next? Visit your health care provider once a year for an annual wellness visit. Ask your health care provider how often you should have your eyes and teeth checked. Stay up to date on all vaccines. This information is not intended to replace advice given to you by your health care  provider. Make sure you discuss any questions you have with your health care provider. Document Revised: 07/01/2020 Document Reviewed: 07/01/2020 Elsevier Patient Education  [place].

## 2021-01-12 ENCOUNTER — Other Ambulatory Visit: Payer: Self-pay | Admitting: Medical

## 2021-01-18 ENCOUNTER — Other Ambulatory Visit: Payer: Self-pay | Admitting: Medical

## 2021-03-06 ENCOUNTER — Other Ambulatory Visit: Payer: Self-pay | Admitting: Medical

## 2021-03-31 ENCOUNTER — Encounter: Payer: Self-pay | Admitting: General Practice

## 2021-04-13 ENCOUNTER — Other Ambulatory Visit: Payer: Self-pay | Admitting: Medical

## 2021-06-18 ENCOUNTER — Other Ambulatory Visit: Payer: Self-pay | Admitting: Medical

## 2021-06-18 NOTE — Telephone Encounter (Signed)
Rx amlodipine sent to pt pharmacy.

## 2021-06-22 ENCOUNTER — Telehealth: Payer: Self-pay | Admitting: Medical

## 2021-06-22 NOTE — Telephone Encounter (Signed)
Pt called [facility] to return call   No TB test on file , pt can set up a lab appt or NV for tb reading

## 2021-06-22 NOTE — Telephone Encounter (Signed)
Pt would like to know if her TB test is expired   Pt would like to pick up a copy if not. Pls contact pt's cell with update

## 2021-06-22 NOTE — Telephone Encounter (Signed)
Pt scheduled for NV 

## 2021-06-28 ENCOUNTER — Ambulatory Visit (INDEPENDENT_AMBULATORY_CARE_PROVIDER_SITE_OTHER): Payer: BC Managed Care – PPO | Admitting: *Deleted

## 2021-06-28 DIAGNOSIS — Z111 Encounter for screening for respiratory tuberculosis: Secondary | ICD-10-CM | POA: Diagnosis not present

## 2021-06-28 NOTE — Progress Notes (Addendum)
Patient here for TB test per PCP.  Injection given in right arm subcutaneously and patient tolerated well.  Mackie Pai, PA-C

## 2021-06-30 ENCOUNTER — Ambulatory Visit (INDEPENDENT_AMBULATORY_CARE_PROVIDER_SITE_OTHER): Payer: BC Managed Care – PPO | Admitting: Medical

## 2021-06-30 DIAGNOSIS — Z111 Encounter for screening for respiratory tuberculosis: Secondary | ICD-10-CM

## 2021-06-30 LAB — TB SKIN TEST
Induration: 0 mm
TB Skin Test: NEGATIVE

## 2021-06-30 NOTE — Progress Notes (Signed)
Pt returned today for a TB test reading.   TB test is negative, letter printed for pt.   Mackie Pai, PA-C

## 2021-07-22 ENCOUNTER — Other Ambulatory Visit: Payer: Self-pay | Admitting: Medical

## 2021-09-06 ENCOUNTER — Other Ambulatory Visit: Payer: Self-pay | Admitting: Medical

## 2021-10-15 ENCOUNTER — Other Ambulatory Visit (HOSPITAL_BASED_OUTPATIENT_CLINIC_OR_DEPARTMENT_OTHER): Payer: Self-pay | Admitting: Medical

## 2021-10-15 DIAGNOSIS — Z1231 Encounter for screening mammogram for malignant neoplasm of breast: Secondary | ICD-10-CM

## 2021-10-16 ENCOUNTER — Other Ambulatory Visit: Payer: Self-pay | Admitting: Medical

## 2021-10-27 ENCOUNTER — Other Ambulatory Visit: Payer: Self-pay | Admitting: Medical

## 2021-10-28 ENCOUNTER — Telehealth: Payer: Self-pay | Admitting: Medical

## 2021-10-28 MED ORDER — METOPROLOL SUCCINATE ER 25 MG PO TB24: 25.0000 mg | ORAL_TABLET | Freq: Every day | ORAL | 1 refills | Status: DC

## 2021-10-28 NOTE — Telephone Encounter (Signed)
Rx sent 

## 2021-10-28 NOTE — Telephone Encounter (Signed)
Medication:  metoprolol succinate (TOPROL-XL) 25 MG 24 hr tablet [[id number]  Has the patient contacted their pharmacy? Yes.   No refills left, advised to call our office  Preferred Pharmacy (with phone number or street name):  [place], [place] [address] Phone: [phone]  Fax: [phone]    Agent: Please be advised that RX refills may take up to 3 business days. We ask that you follow-up with your pharmacy.

## 2021-11-22 ENCOUNTER — Inpatient Hospital Stay (HOSPITAL_BASED_OUTPATIENT_CLINIC_OR_DEPARTMENT_OTHER): Admission: RE | Admit: 2021-11-22 | Payer: BC Managed Care – PPO | Source: Ambulatory Visit

## 2021-12-01 ENCOUNTER — Encounter (HOSPITAL_BASED_OUTPATIENT_CLINIC_OR_DEPARTMENT_OTHER): Payer: Self-pay

## 2021-12-01 ENCOUNTER — Ambulatory Visit (HOSPITAL_BASED_OUTPATIENT_CLINIC_OR_DEPARTMENT_OTHER)
Admission: RE | Admit: 2021-12-01 | Discharge: 2021-12-01 | Disposition: A | Discharge status: Home / Self Care | Payer: BC Managed Care – PPO | Source: Ambulatory Visit | Attending: Medical | Admitting: Medical

## 2021-12-01 DIAGNOSIS — Z1231 Encounter for screening mammogram for malignant neoplasm of breast: Secondary | ICD-10-CM | POA: Insufficient documentation

## 2021-12-10 ENCOUNTER — Other Ambulatory Visit: Payer: Self-pay | Admitting: Medical

## 2022-01-05 ENCOUNTER — Ambulatory Visit: Payer: BC Managed Care – PPO | Admitting: Medical

## 2022-01-05 ENCOUNTER — Encounter: Payer: Self-pay | Admitting: Medical

## 2022-01-05 VITALS — BP 145/80 | HR 88 | Temp 98.4°F | Resp 18 | Ht 66.0 in | Wt 236.4 lb

## 2022-01-05 DIAGNOSIS — R0982 Postnasal drip: Secondary | ICD-10-CM

## 2022-01-05 DIAGNOSIS — E119 Type 2 diabetes mellitus without complications: Secondary | ICD-10-CM | POA: Diagnosis not present

## 2022-01-05 DIAGNOSIS — J302 Other seasonal allergic rhinitis: Secondary | ICD-10-CM

## 2022-01-05 DIAGNOSIS — J040 Acute laryngitis: Secondary | ICD-10-CM

## 2022-01-05 LAB — COMPREHENSIVE METABOLIC PANEL
ALT: 27 U/L (ref 0–35)
AST: 26 U/L (ref 0–37)
Albumin: 4.6 g/dL (ref 3.5–5.2)
Alkaline Phosphatase: 67 U/L (ref 39–117)
BUN: 11 mg/dL (ref 6–23)
CO2: 34 mEq/L — ABNORMAL HIGH (ref 19–32)
Calcium: 9.7 mg/dL (ref 8.4–10.5)
Chloride: 101 mEq/L (ref 96–112)
Creatinine, Ser: 0.88 mg/dL (ref 0.40–1.20)
GFR: 75.43 mL/min (ref >60.00)
Glucose, Bld: 105 mg/dL — ABNORMAL HIGH (ref 70–99)
Potassium: 4.5 mEq/L (ref 3.5–5.1)
Sodium: 142 mEq/L (ref 135–145)
Total Bilirubin: 0.5 mg/dL (ref 0.2–1.2)
Total Protein: 7.4 g/dL (ref 6.0–8.3)

## 2022-01-05 LAB — HEMOGLOBIN A1C: Hgb A1c MFr Bld: 7.1 % — ABNORMAL HIGH (ref 4.6–6.5)

## 2022-01-05 LAB — GLUCOSE, POCT (MANUAL RESULT ENTRY): POC Glucose: 120 mg/dl — AB (ref 70–99)

## 2022-01-05 MED ORDER — METHYLPREDNISOLONE 4 MG PO TABS: ORAL_TABLET | ORAL | 0 refills | Status: DC

## 2022-01-05 NOTE — Addendum Note (Signed)
Addended by: Jeronimo Greaves on: 01/05/2022 10:13 AM   Modules accepted: Orders

## 2022-01-05 NOTE — Progress Notes (Signed)
Subjective:    Patient ID: Christina Leblanc, female    DOB: 1970-05-14, 51 y.o.   MRN: [id number]  HPI Pt had mild hoarse voice and dry cough mostly but occasionally productive. This has been case for 10-14 days. Early on was very hoarse.  Pt tested for covid early on and was negative. Does have some pnd.  She states never really felt bad. Hoarse voice is a lot better. At one point could not talk for 2 days.   Pt has been using corcidin. Also using her astelin, flonase and xyzal.  No fevers, no chills or sweats.   Pt does not smoke.   Review of Systems  Constitutional:  Negative for chills, fatigue and fever.  HENT:  Positive for congestion and postnasal drip. Negative for ear pain, sinus pressure and sinus pain.        Hoarse voice  Respiratory:  Negative for cough, chest tightness, shortness of breath and wheezing.   Cardiovascular:  Negative for chest pain and palpitations.  Gastrointestinal:  Negative for abdominal pain.  Musculoskeletal:  Negative for back pain and gait problem.  Neurological:  Negative for dizziness, syncope, numbness and headaches.  Hematological:  Negative for adenopathy.    Past Medical History:  Diagnosis Date   Allergy    Diabetes mellitus without complication ([place])    Hyperlipidemia    Hypertension      Social History   Socioeconomic History   Marital status: Divorced    Spouse name: Not on file   Number of children: Not on file   Years of education: Not on file   Highest education level: Not on file  Occupational History   Not on file  Tobacco Use   Smoking status: Never   Smokeless tobacco: Never  Vaping Use   Vaping Use: Never used  Substance and Sexual Activity   Alcohol use: Yes    Alcohol/week: 0.0 standard drinks of alcohol    Comment: wine occasionally .Twice.   Drug use: No   Sexual activity: Not Currently    Birth control/protection: Surgical  Other Topics Concern   Not on file  Social History Narrative   Not on  file   Social Determinants of Health   Financial Resource Strain: Not on file  Food Insecurity: Not on file  Transportation Needs: Not on file  Physical Activity: Not on file  Stress: Not on file  Social Connections: Not on file  Intimate Partner Violence: Not on file    Past Surgical History:  Procedure Laterality Date   ABDOMINAL HYSTERECTOMY     2003   MYOMECTOMY ABDOMINAL APPROACH      Family History  Problem Relation Age of Onset   Diabetes Mother    Diabetes Father    Heart disease Father    Cancer Brother    Hypertension Brother    Colon polyps Neg Hx    Colon cancer Neg Hx    Esophageal cancer Neg Hx    Stomach cancer Neg Hx    Rectal cancer Neg Hx     Allergies  Allergen Reactions   Claritin [Loratadine] Palpitations    Current Outpatient Medications on File Prior to Visit  Medication Sig Dispense Refill   acetaminophen (TYLENOL) 500 MG tablet Take 500 mg by mouth every 6 (six) hours as needed.     amLODipine (NORVASC) 5 MG tablet TAKE 1 TABLET BY MOUTH ONCE DAILY . APPOINTMENT REQUIRED FOR FUTURE REFILLS 90 tablet 0   Azelastine HCl 137  MCG/SPRAY SOLN USE 2 SPRAY(S) IN EACH NOSTRIL AT BEDTIME AS NEEDED 30 mL 0   blood glucose meter kit and supplies Check blood sugar daily  (E11.9). 1 each 0   chlorthalidone (HYGROTON) 25 MG tablet Take 1 tablet (25 mg total) by mouth daily. 90 tablet 3   cyclobenzaprine (FLEXERIL) 5 MG tablet Take 1 tablet (5 mg total) by mouth at bedtime. 10 tablet 0   famotidine (PEPCID) 20 MG tablet Take 1 tablet (20 mg total) by mouth daily. 30 tablet 2   fluticasone (FLONASE) 50 MCG/ACT nasal spray Place 2 sprays into both nostrils daily. 16 g 1   levocetirizine (XYZAL) 5 MG tablet TAKE 1 TABLET BY MOUTH ONCE DAILY IN THE EVENING 90 tablet 0   linagliptin (TRADJENTA) 5 MG TABS tablet Take 1 tablet (5 mg total) by mouth daily. 30 tablet 3   metFORMIN (GLUCOPHAGE) 1000 MG tablet Take 1 tablet (1,000 mg total) by mouth 2 (two) times  daily with a meal. 180 tablet 3   metoprolol succinate (TOPROL-XL) 25 MG 24 hr tablet Take 1 tablet (25 mg total) by mouth daily. 90 tablet 1   rosuvastatin (CRESTOR) 40 MG tablet Take 1 tablet by mouth once daily 90 tablet 0   No current facility-administered medications on file prior to visit.    BP (!) 145/80   Pulse 88   Temp 98.4 F (36.9 C)   Resp 18   Ht _0  (1.676 m)   Wt 236 lb 6.4 oz (107.2 kg)   SpO2 98%   BMI 38.16 kg/m        Objective:   Physical Exam  General- No acute distress. Pleasant patient. Neck- Full range of motion, no jvd Lungs- Clear, even and unlabored. Heart- regular rate and rhythm. Neurologic- CNII- XII grossly intact.       Assessment & Plan:   Patient Instructions  Recent likely allergic rhinitis symptoms. Continue you allergy meds and will add on 6 day taper of medrol.  Laryngitis- your voice seems to be very close to baseline. If within a week you don't feel back to normal then would refer to ENT.  Diabetes- sugar today 120 in office. Will get cmp and A1c today. While on medrol check sugars daily. If any over 200 let me know. Take diabetic med and eat low sugar diet.  Htn- bp borderline high today. Continue current meds. Most of time  you report top number less than 140 so probable white coat component.  Follow up 1-2 months wellness exam. Sooner if above signs/symptoms persist.   Mackie Pai, PA-C

## 2022-01-05 NOTE — Patient Instructions (Addendum)
Recent likely allergic rhinitis symptoms. Continue you allergy meds and will add on 6 day taper of medrol.  If your occasionally productive cough worsens or chest congeston let me know. In that event rx antibiotic. Not indicated presently.  Laryngitis- your voice seems to be very close to baseline. If within a week you don't feel back to normal then would refer to ENT.  Diabetes- sugar today 120 in office. Will get cmp and A1c today. While on medrol check sugars daily. If any over 200 let me know. Take diabetic med and eat low sugar diet.  Htn- bp borderline high today. Continue current meds. Most of time  you report top number less than 140 so probable white coat component.  Follow up 1-2 months wellness exam. Sooner if above signs/symptoms persist.

## 2022-01-22 ENCOUNTER — Other Ambulatory Visit: Payer: Self-pay | Admitting: Medical

## 2022-01-23 ENCOUNTER — Other Ambulatory Visit: Payer: Self-pay | Admitting: Medical

## 2022-04-02 ENCOUNTER — Other Ambulatory Visit: Payer: Self-pay | Admitting: Medical

## 2022-04-15 ENCOUNTER — Other Ambulatory Visit: Payer: Self-pay | Admitting: Medical

## 2022-04-21 ENCOUNTER — Other Ambulatory Visit: Payer: Self-pay | Admitting: Medical

## 2022-04-25 ENCOUNTER — Encounter: Payer: Self-pay | Admitting: Medical

## 2022-04-25 ENCOUNTER — Ambulatory Visit: Payer: BC Managed Care – PPO | Admitting: Medical

## 2022-04-25 VITALS — BP 140/88 | HR 88 | Temp 98.7°F | Resp 18 | Ht 65.0 in | Wt 236.8 lb

## 2022-04-25 DIAGNOSIS — J302 Other seasonal allergic rhinitis: Secondary | ICD-10-CM | POA: Diagnosis not present

## 2022-04-25 DIAGNOSIS — E119 Type 2 diabetes mellitus without complications: Secondary | ICD-10-CM | POA: Diagnosis not present

## 2022-04-25 DIAGNOSIS — R0982 Postnasal drip: Secondary | ICD-10-CM | POA: Diagnosis not present

## 2022-04-25 MED ORDER — AZELASTINE HCL 0.1 % NA SOLN: NASAL | 12 refills | Status: AC

## 2022-04-25 MED ORDER — LEVOCETIRIZINE DIHYDROCHLORIDE 5 MG PO TABS: 5.0000 mg | ORAL_TABLET | Freq: Every evening | ORAL | 3 refills | Status: AC

## 2022-04-25 MED ORDER — FLUTICASONE PROPIONATE 50 MCG/ACT NA SUSP: NASAL | 1 refills | Status: AC

## 2022-04-25 NOTE — Patient Instructions (Addendum)
Post-nasal drainage and Seasonal allergic rhinitis, unspecified trigger. Refilled xyal. Continue astelin and add on flonase. If symptoms not improving by wed or Thursday would consider adding medrol taper. But want to know A1c and what finger stick bs are at home.    Type 2 diabetes mellitus without complication, without long-term current use of insulin Get below labs by wed. Also check sugars early morning fasting and at least one post meal check. Update me on fingerstick reading by Wednesday. Continue metformin   - Comp Met (CMET); Future - Hemoglobin A1c; Future   Follow up date to be determined after lab review.

## 2022-04-25 NOTE — Progress Notes (Signed)
   Subjective:    Patient ID: Christina Leblanc, female    DOB: 1970-06-26, 52 y.o.   MRN: [id number]  HPI  Pt has nasal congestion, post nasal drainage and runny nose.   She state yearly has spring allergies. Symptoms started on past Thursday.  Pt has been on xyzal and astelin at night. She stats has not been on flonase.  No fever, no chills, no sweats or body aches.   Pt just started new job with [employer].   Pt states bp at home recently was 135/80.  Review of Systems  Constitutional:  Negative for chills, fatigue and fever.  HENT:  Positive for congestion, postnasal drip and rhinorrhea. Negative for ear pain, sinus pressure, sinus pain and sore throat.   Respiratory:  Positive for cough. Negative for chest tightness and shortness of breath.        Minimal cough just today.  Cardiovascular:  Negative for chest pain and palpitations.  Gastrointestinal:  Negative for abdominal pain.  Genitourinary:  Negative for dyspareunia, enuresis and flank pain.  Musculoskeletal:  Negative for back pain.  Neurological:  Negative for facial asymmetry and light-headedness.  Hematological:  Negative for adenopathy. Does not bruise/bleed easily.  Psychiatric/Behavioral:  Negative for behavioral problems and confusion.             Objective:   Physical Exam  General Mental Status- Alert. General Appearance- Not in acute distress.   Skin General: Color- Normal Color. Moisture- Normal Moisture.  Neck Carotid Arteries- Normal color. Moisture- Normal Moisture. No carotid bruits. No JVD.  Chest and Lung Exam Auscultation: Breath Sounds:-Normal.  Cardiovascular Auscultation:Rythm- Regular. Murmurs & Other Heart Sounds:Auscultation of the heart reveals- No Murmurs.  Abdomen Inspection:-Inspeection Normal. Palpation/Percussion:Note:No mass. Palpation and Percussion of the abdomen reveal- Non Tender, Non Distended + BS, no rebound or guarding.    Neurologic Cranial Nerve exam:- CN  III-XII intact(No nystagmus), symmetric smile. Strength:- 5/5 equal and symmetric strength both upper and lower extremities.   Heent- no sinus pressure on palpation. Boggy turbinates. +pnd.      Assessment & Plan:   Patient Instructions  Post-nasal drainage and Seasonal allergic rhinitis, unspecified trigger. Refilled xyal. Continue astelin and add on flonase. If symptoms not improving by wed or Thursday would consider adding medrol taper. But want to know A1c and what finger stick bs are at home.    Type 2 diabetes mellitus without complication, without long-term current use of insulin Get below labs by wed. Also check sugars early morning fasting and at least one post meal check. Update me on fingerstick reading by Wednesday. Continue metformin   - Comp Met (CMET); Future - Hemoglobin A1c; Future   Follow up date to be determined after lab review.     Esperanza Richters, PA-C

## 2022-04-27 ENCOUNTER — Other Ambulatory Visit: Payer: BC Managed Care – PPO

## 2022-07-11 ENCOUNTER — Other Ambulatory Visit: Payer: Self-pay | Admitting: Medical

## 2022-07-17 ENCOUNTER — Other Ambulatory Visit: Payer: Self-pay | Admitting: Medical

## 2022-08-11 ENCOUNTER — Other Ambulatory Visit (INDEPENDENT_AMBULATORY_CARE_PROVIDER_SITE_OTHER): Payer: 59

## 2022-08-11 DIAGNOSIS — E119 Type 2 diabetes mellitus without complications: Secondary | ICD-10-CM | POA: Diagnosis not present

## 2022-08-11 LAB — COMPREHENSIVE METABOLIC PANEL
ALT: 24 U/L (ref 0–35)
AST: 28 U/L (ref 0–37)
Albumin: 4.4 g/dL (ref 3.5–5.2)
Alkaline Phosphatase: 64 U/L (ref 39–117)
BUN: 11 mg/dL (ref 6–23)
CO2: 32 mEq/L (ref 19–32)
Calcium: 9.6 mg/dL (ref 8.4–10.5)
Chloride: 102 mEq/L (ref 96–112)
Creatinine, Ser: 0.83 mg/dL (ref 0.40–1.20)
GFR: 80.57 mL/min (ref >60.00)
Glucose, Bld: 118 mg/dL — ABNORMAL HIGH (ref 70–99)
Potassium: 4.2 mEq/L (ref 3.5–5.1)
Sodium: 141 mEq/L (ref 135–145)
Total Bilirubin: 0.6 mg/dL (ref 0.2–1.2)
Total Protein: 7.1 g/dL (ref 6.0–8.3)

## 2022-08-11 LAB — HEMOGLOBIN A1C: Hgb A1c MFr Bld: 6.9 % — ABNORMAL HIGH (ref 4.6–6.5)

## 2022-09-14 ENCOUNTER — Ambulatory Visit (INDEPENDENT_AMBULATORY_CARE_PROVIDER_SITE_OTHER): Payer: 59 | Admitting: Medical

## 2022-09-14 VITALS — BP 130/76 | HR 86 | Temp 98.5°F | Resp 18 | Ht 65.0 in | Wt 234.1 lb

## 2022-09-14 DIAGNOSIS — R0982 Postnasal drip: Secondary | ICD-10-CM | POA: Diagnosis not present

## 2022-09-14 DIAGNOSIS — R062 Wheezing: Secondary | ICD-10-CM

## 2022-09-14 DIAGNOSIS — R059 Cough, unspecified: Secondary | ICD-10-CM | POA: Diagnosis not present

## 2022-09-14 DIAGNOSIS — J302 Other seasonal allergic rhinitis: Secondary | ICD-10-CM | POA: Diagnosis not present

## 2022-09-14 MED ORDER — BENZONATATE 100 MG PO CAPS: 100.0000 mg | ORAL_CAPSULE | Freq: Three times a day (TID) | ORAL | 0 refills | Status: DC | PRN

## 2022-09-14 MED ORDER — BUDESONIDE-FORMOTEROL FUMARATE 160-4.5 MCG/ACT IN AERO: 2.0000 | INHALATION_SPRAY | Freq: Two times a day (BID) | RESPIRATORY_TRACT | 3 refills | Status: AC

## 2022-09-14 NOTE — Patient Instructions (Signed)
Seasonal Allergies Severe cough for one week with postnasal drainage, worse at night. No sneezing or nasal congestion. Partial use of Flonase and daily use of Xyzal. No current use of Astelin or Singulair. History of asthma with wheezing during severe allergy flares or respiratory infections. -Continue Xyzal daily. -Resume Flonase daily. -Prescribe Benzonatate for cough. -Prescribe Symbicort, two inhalations twice a day. -Consider chest x-ray if severe cough persists. -Consider adding Medrol if cough persists/wheezing recoccurs, monitor blood sugars due to potential hyperglycemia if add on medrol.  Type 2 Diabetes A1c 6.9 (average glucose 149), on Metformin 1000mg  twice daily. -Continue Metformin 1000mg  twice daily. -Monitor fasting and postprandial blood sugars over the next three days in anticipation of potential need for Medrol.  Potential Respiratory Infection If symptoms change to include thick, yellowish-brown mucus and chest congestion, consider adding antibiotic. Currently, no signs of infection on lung exam. -Add antibiotic if symptoms change to indicate potential bacterial infection.   Follow up prn for above but 1-2 month for wellness exam.

## 2022-09-14 NOTE — Progress Notes (Addendum)
   Subjective:    Patient ID: Christina Leblanc, female    DOB: January 06, 1971, 52 y.o.   MRN: [id number]  HPI  Discussed the use of AI scribe software for clinical note transcription with the patient, who gave verbal consent to proceed.  History of Present Illness   The patient, with a history of seasonal allergies and occasional asthma, presents with a one-week history of a severe cough. The cough, which is worse at night, is associated with postnasal drainage and has been so severe that it has caused headaches and back muscle strain. The patient reported hearing wheezing on one occasion but has not experienced it since. She denies nasal congestion, sneezing, and the typical watery eyes associated with her usual allergy flare-ups.  The patient has been taking Xyzal for allergies but has not been consistent with the use of Flonase. She reported a history of wheezing during severe allergy flares or during a cold or lung infection. The patient's sleep has been significantly disturbed due to the severity of the cough, and she has been using home remedies such as lemon tea with cayenne pepper for relief.  The patient also underwent a COVID test five days ago, which returned negative. She denies experiencing fever, chills, sweats, or body aches. The patient has a history of diabetes, with a recent A1c of 6.9, and is currently on metformin 1000 mg twice daily. She reported no new medications.        Review of Systems See hpi.    Objective:   Physical Exam  General Mental Status- Alert. General Appearance- Not in acute distress.   Skin General: Color- Normal Color. Moisture- Normal Moisture.  Neck Carotid Arteries- Normal color. Moisture- Normal Moisture. No carotid bruits. No JVD.  Chest and Lung Exam Auscultation: Breath Sounds:-Normal.  Cardiovascular Auscultation:Rythm- Regular. Murmurs & Other Heart Sounds:Auscultation of the heart reveals- No Murmurs.   Neurologic Cranial Nerve  exam:- CN III-XII intact(No nystagmus), symmetric smile. Strength:- 5/5 equal and symmetric strength both upper and lower extremities.   Heent- no sinus pressure. Boggy turbinates. +pnd. Ears canal clear and normal tms.  Lower ext- calfs symmetric, negative homans signs. No pedal edema.    Assessment & Plan:   Assessment and Plan    Seasonal Allergies Severe cough for one week with postnasal drainage, worse at night. No sneezing or nasal congestion. Partial use of Flonase and daily use of Xyzal. No current use of Astelin or Singulair. History of asthma with wheezing during severe allergy flares or respiratory infections. -Continue Xyzal daily. -Resume Flonase daily. -Prescribe Benzonatate for cough. -Prescribe Symbicort, two inhalations twice a day. -Consider chest x-ray if severe cough persists. -Consider adding Medrol if cough persists/wheezing recoccurs, monitor blood sugars due to potential hyperglycemia if add on medrol.  Type 2 Diabetes A1c 6.9 (average glucose 149), on Metformin 1000mg  twice daily. -Continue Metformin 1000mg  twice daily. -Monitor fasting and postprandial blood sugars over the next three days in anticipation of potential need for Medrol.  Potential Respiratory Infection If symptoms change to include thick, yellowish-brown mucus and chest congestion, consider adding antibiotic. Currently, no signs of infection on lung exam. -Add antibiotic if symptoms change to indicate potential bacterial infection.   Follow up prn for above but 1-2 month for wellness exam.        Esperanza Richters, PA-C

## 2022-10-25 ENCOUNTER — Other Ambulatory Visit: Payer: Self-pay | Admitting: Medical

## 2022-10-26 ENCOUNTER — Other Ambulatory Visit (HOSPITAL_BASED_OUTPATIENT_CLINIC_OR_DEPARTMENT_OTHER): Payer: Self-pay | Admitting: Medical

## 2022-10-26 DIAGNOSIS — Z1231 Encounter for screening mammogram for malignant neoplasm of breast: Secondary | ICD-10-CM

## 2022-12-05 ENCOUNTER — Ambulatory Visit (HOSPITAL_BASED_OUTPATIENT_CLINIC_OR_DEPARTMENT_OTHER)
Admission: RE | Admit: 2022-12-05 | Discharge: 2022-12-05 | Disposition: A | Discharge status: Home / Self Care | Payer: 59 | Source: Ambulatory Visit | Attending: Medical | Admitting: Medical

## 2022-12-05 ENCOUNTER — Encounter (HOSPITAL_BASED_OUTPATIENT_CLINIC_OR_DEPARTMENT_OTHER): Payer: Self-pay

## 2022-12-05 DIAGNOSIS — Z1231 Encounter for screening mammogram for malignant neoplasm of breast: Secondary | ICD-10-CM | POA: Diagnosis present

## 2023-01-25 ENCOUNTER — Other Ambulatory Visit: Payer: Self-pay | Admitting: Medical

## 2023-04-28 ENCOUNTER — Other Ambulatory Visit: Payer: Self-pay | Admitting: Medical

## 2023-05-01 ENCOUNTER — Other Ambulatory Visit: Payer: Self-pay | Admitting: Medical

## 2023-05-05 ENCOUNTER — Other Ambulatory Visit: Payer: Self-pay | Admitting: Medical

## 2023-05-08 ENCOUNTER — Other Ambulatory Visit: Payer: Self-pay | Admitting: Emergency Medicine

## 2023-05-08 NOTE — Telephone Encounter (Signed)
 Copied from CRM [id number]. Topic: Clinical - Medication Refill >> May 08, 2023  2:34 PM Clydene Darner H wrote: Most Recent Primary Care Visit:  Provider: Sylvia Everts  Department: [facility]  Visit Type: OFFICE VISIT  Date: 09/14/2022  Medication: Rosuvastatin    Has the patient contacted their pharmacy? Yes  (Agent: If no, request that the patient contact the pharmacy for the refill. If patient does not wish to contact the pharmacy document the reason why and proceed with request.) (Agent: If yes, when and what did the pharmacy advise?)  Is this the correct pharmacy for this prescription? Yes If no, delete pharmacy and type the correct one.  This is the patient's preferred pharmacy:  [id number] - [place], [place] - [address] [place] [address] Phone: [phone] Fax: [phone]   Has the prescription been filled recently? No  Is the patient out of the medication? No Pt was given 3 tablets by pharmacy to hold her over for the weekend and is now out.   Has the patient been seen for an appointment in the last year OR does the patient have an upcoming appointment? No  Can we respond through MyChart? No  Agent: Please be advised that Rx refills may take up to 3 business days. We ask that you follow-up with your pharmacy.

## 2023-05-14 ENCOUNTER — Other Ambulatory Visit: Payer: Self-pay | Admitting: Medical

## 2023-06-08 ENCOUNTER — Other Ambulatory Visit: Payer: Self-pay | Admitting: Medical

## 2023-06-09 ENCOUNTER — Encounter: Payer: Self-pay | Admitting: Medical

## 2023-06-09 ENCOUNTER — Ambulatory Visit (INDEPENDENT_AMBULATORY_CARE_PROVIDER_SITE_OTHER): Admitting: Medical

## 2023-06-09 VITALS — BP 136/82 | HR 91 | Ht 65.0 in | Wt 242.2 lb

## 2023-06-09 DIAGNOSIS — Z7984 Long term (current) use of oral hypoglycemic drugs: Secondary | ICD-10-CM | POA: Diagnosis not present

## 2023-06-09 DIAGNOSIS — Z0000 Encounter for general adult medical examination without abnormal findings: Secondary | ICD-10-CM | POA: Diagnosis not present

## 2023-06-09 DIAGNOSIS — I10 Essential (primary) hypertension: Secondary | ICD-10-CM | POA: Diagnosis not present

## 2023-06-09 DIAGNOSIS — E785 Hyperlipidemia, unspecified: Secondary | ICD-10-CM

## 2023-06-09 DIAGNOSIS — R87619 Unspecified abnormal cytological findings in specimens from cervix uteri: Secondary | ICD-10-CM

## 2023-06-09 DIAGNOSIS — E119 Type 2 diabetes mellitus without complications: Secondary | ICD-10-CM

## 2023-06-09 LAB — LIPID PANEL
Cholesterol: 150 mg/dL (ref 0–200)
HDL: 47 mg/dL (ref >39.00)
LDL Cholesterol: 88 mg/dL (ref 0–99)
NonHDL: 102.93
Total CHOL/HDL Ratio: 3
Triglycerides: 75 mg/dL (ref 0.0–149.0)
VLDL: 15 mg/dL (ref 0.0–40.0)

## 2023-06-09 LAB — CBC WITH DIFFERENTIAL/PLATELET
Basophils Absolute: 0 10*3/uL (ref 0.0–0.1)
Basophils Relative: 0.7 % (ref 0.0–3.0)
Eosinophils Absolute: 0.1 10*3/uL (ref 0.0–0.7)
Eosinophils Relative: 1.3 % (ref 0.0–5.0)
HCT: 41.5 % (ref 36.0–46.0)
Hemoglobin: 13 g/dL (ref 12.0–15.0)
Lymphocytes Relative: 44.4 % (ref 12.0–46.0)
Lymphs Abs: 1.8 10*3/uL (ref 0.7–4.0)
MCHC: 31.4 g/dL (ref 30.0–36.0)
MCV: 81.3 fl (ref 78.0–100.0)
Monocytes Absolute: 0.4 10*3/uL (ref 0.1–1.0)
Monocytes Relative: 8.8 % (ref 3.0–12.0)
Neutro Abs: 1.9 10*3/uL (ref 1.4–7.7)
Neutrophils Relative %: 44.8 % (ref 43.0–77.0)
Platelets: 318 10*3/uL (ref 150.0–400.0)
RBC: 5.1 Mil/uL (ref 3.87–5.11)
RDW: 15.9 % — ABNORMAL HIGH (ref 11.5–15.5)
WBC: 4.1 10*3/uL (ref 4.0–10.5)

## 2023-06-09 LAB — COMPREHENSIVE METABOLIC PANEL WITH GFR
ALT: 23 U/L (ref 0–35)
AST: 25 U/L (ref 0–37)
Albumin: 4.7 g/dL (ref 3.5–5.2)
Alkaline Phosphatase: 60 U/L (ref 39–117)
BUN: 8 mg/dL (ref 6–23)
CO2: 34 meq/L — ABNORMAL HIGH (ref 19–32)
Calcium: 9.4 mg/dL (ref 8.4–10.5)
Chloride: 102 meq/L (ref 96–112)
Creatinine, Ser: 0.82 mg/dL (ref 0.40–1.20)
GFR: 81.28 mL/min (ref >60.00)
Glucose, Bld: 125 mg/dL — ABNORMAL HIGH (ref 70–99)
Potassium: 3.8 meq/L (ref 3.5–5.1)
Sodium: 142 meq/L (ref 135–145)
Total Bilirubin: 0.5 mg/dL (ref 0.2–1.2)
Total Protein: 7.4 g/dL (ref 6.0–8.3)

## 2023-06-09 LAB — MICROALBUMIN / CREATININE URINE RATIO
Creatinine,U: 45 mg/dL
Microalb Creat Ratio: 78.8 mg/g — ABNORMAL HIGH (ref 0.0–30.0)
Microalb, Ur: 3.5 mg/dL — ABNORMAL HIGH (ref 0.0–1.9)

## 2023-06-09 LAB — HEMOGLOBIN A1C: Hgb A1c MFr Bld: 7.2 % — ABNORMAL HIGH (ref 4.6–6.5)

## 2023-06-09 NOTE — Progress Notes (Signed)
 Subjective:    Patient ID: Christina Leblanc, female    DOB: 05/25/1970, 53 y.o.   MRN: [id number]  HPI Pt in for wellness exam.(Also did go ahead and combine wellness with chronic med problem visit)   Pt still working at [employer]. Pt recently exercising some at [employer]. Admits not eating very healthy. But has stopped drinking sodas. Nonsmoker. No alcohol.    Up to date on colonoscopy.Repeat per pt should be done in 2027. Pt not up to date on pap. HPV on last pap 2022. She did not keep follow up. Placed referral today and explained on imprortance.  Up to date on mammogram   Pt had 5 covid vaccines.  Defers pneumonia vaccine. Pt will check with insurance if shingrix covered.(I don't see on care gap list. Pt states she has not gotten. Though she declines.  Tetanus update deferred to other day. Pt made aware she is due for update.   Pt has seen optometrist. She states had diabetic eye exam. We did not get copy of diabetic eye exam.   Tonna Frederic- pt states former heart burn resolved with better diet alone.   Htn- on metoprolol  and chorthalidone. BP well controlled   Hyperlipidemia- on crestor .   Review of Systems  Constitutional:  Negative for chills, fatigue and fever.  HENT:  Positive for postnasal drip. Negative for congestion, ear pain, sinus pressure and sinus pain.        Hoarse voice  Respiratory:  Negative for cough, chest tightness, shortness of breath and wheezing.   Cardiovascular:  Negative for chest pain and palpitations.  Gastrointestinal:  Negative for abdominal pain, diarrhea, rectal pain and vomiting.  Genitourinary:  Negative for dysuria, frequency, hematuria and urgency.  Musculoskeletal:  Negative for back pain and gait problem.  Skin:  Negative for rash.  Neurological:  Negative for dizziness, syncope, numbness and headaches.  Hematological:  Negative for adenopathy.  Psychiatric/Behavioral:  Negative for behavioral problems, dysphoric mood and suicidal ideas. The  patient is not nervous/anxious.     Past Medical History:  Diagnosis Date   Allergy    Diabetes mellitus without complication (HCC)    Hyperlipidemia    Hypertension      Social History   Socioeconomic History   Marital status: Divorced    Spouse name: Not on file   Number of children: Not on file   Years of education: Not on file   Highest education level: Not on file  Occupational History   Not on file  Tobacco Use   Smoking status: Never   Smokeless tobacco: Never  Vaping Use   Vaping status: Never Used  Substance and Sexual Activity   Alcohol use: Yes    Alcohol/week: 0.0 standard drinks of alcohol    Comment: wine occasionally .Twice.   Drug use: No   Sexual activity: Not Currently    Birth control/protection: Surgical  Other Topics Concern   Not on file  Social History Narrative   Not on file   Social Drivers of Health   Financial Resource Strain: Not on file  Food Insecurity: Not on file  Transportation Needs: Not on file  Physical Activity: Not on file  Stress: Not on file  Social Connections: Not on file  Intimate Partner Violence: Not on file    Past Surgical History:  Procedure Laterality Date   ABDOMINAL HYSTERECTOMY     2003   MYOMECTOMY ABDOMINAL APPROACH      Family History  Problem Relation Age of Onset  Diabetes Mother    Diabetes Father    Heart disease Father    Cancer Brother    Hypertension Brother    Colon polyps Neg Hx    Colon cancer Neg Hx    Esophageal cancer Neg Hx    Stomach cancer Neg Hx    Rectal cancer Neg Hx     Allergies  Allergen Reactions   Claritin  [Loratadine ] Palpitations    Current Outpatient Medications on File Prior to Visit  Medication Sig Dispense Refill   acetaminophen (TYLENOL) 500 MG tablet Take 500 mg by mouth every 6 (six) hours as needed.     amLODipine  (NORVASC ) 5 MG tablet Take 1 tablet (5 mg total) by mouth daily. 30 tablet 0   azelastine  (ASTELIN ) 0.1 % nasal spray 2 sprays each  nostril at night. 30 mL 12   blood glucose meter kit and supplies Check blood sugar daily  (E11.9). 1 each 0   budesonide -formoterol  (SYMBICORT ) 160-4.5 MCG/ACT inhaler Inhale 2 puffs into the lungs 2 (two) times daily. 1 each 3   chlorthalidone  (HYGROTON ) 25 MG tablet Take 1 tablet (25 mg total) by mouth daily. 90 tablet 3   cyclobenzaprine  (FLEXERIL ) 5 MG tablet Take 1 tablet (5 mg total) by mouth at bedtime. 10 tablet 0   famotidine  (PEPCID ) 20 MG tablet Take 1 tablet (20 mg total) by mouth daily. 30 tablet 2   fluticasone  (FLONASE ) 50 MCG/ACT nasal spray 2 sprays each nostril daily in morning. 16 g 1   levocetirizine (XYZAL ) 5 MG tablet Take 1 tablet (5 mg total) by mouth every evening. 90 tablet 3   linagliptin  (TRADJENTA ) 5 MG TABS tablet Take 1 tablet (5 mg total) by mouth daily. 30 tablet 3   metFORMIN  (GLUCOPHAGE ) 1000 MG tablet TAKE 1 TABLET BY MOUTH TWICE DAILY WITH MEALS 180 tablet 0   methylPREDNISolone  (MEDROL ) 4 MG tablet Standard 6 day taper dose 21 tablet 0   metoprolol  succinate (TOPROL -XL) 25 MG 24 hr tablet Take 1 tablet by mouth once daily 90 tablet 0   rosuvastatin  (CRESTOR ) 40 MG tablet Take 1 tablet by mouth once daily 90 tablet 0   No current facility-administered medications on file prior to visit.    BP 136/82   Pulse 91   Ht 5\' 5"  (1.651 m)   Wt 242 lb 3.2 oz (109.9 kg)   SpO2 97%   BMI 40.30 kg/m        Objective:   Physical Exam  General Mental Status- Alert. General Appearance- Not in acute distress.    Neck Carotid Arteries- Normal color. Moisture- Normal Moisture. No carotid bruits. No JVD.  Chest and Lung Exam Auscultation: Breath Sounds:-CTA  Cardiovascular Auscultation:Rythm- RRR Murmurs & Other Heart Sounds:Auscultation of the heart reveals- No Murmurs.  Abdomen Inspection:-Inspeection Normal. Palpation/Percussion:Note:No mass. Palpation and Percussion of the abdomen reveal- Non Tender, Non Distended + BS, no rebound or  guarding.   Neurologic Cranial Nerve exam:- CN III-XII intact(No nystagmus), symmetric smile. Strength:- 5/5 equal and symmetric strength both upper and lower extremities.    Lower ext- see quality metrics    Assessment & Plan:   For you wellness exam today I have ordered cbc, cmp and  lipid panel.    Vaccine given today. Pt declines vaccines today.    Recommend exercise and healthy diet.   We will let you know lab results as they come in.   Follow up date appointment will be determined after lab review.     For  diabetes will follow A1c and get urine microalbumin study. Continue metformin  but she admits stated on using 1/2 of 1000 mg once daily. She did this due loose stools that resolved when took less of med.   For high cholesterol- continue crestor  and make changes if lipid level elevated.   For htn- continue toprol  and amlodipine . Bp wel controlled.     Follow up date to be determined after lab review.  Sylvia Everts, PA-C   [id number] charge as did address htn,  bn, abnormal pap hx, diabetes and high cholesterol

## 2023-06-09 NOTE — Patient Instructions (Addendum)
 For you wellness exam today I have ordered cbc, cmp and  lipid panel.    Vaccine given today. Pt declines vaccines today.    Recommend exercise and healthy diet.   We will let you know lab results as they come in.   Follow up date appointment will be determined after lab review.     For diabetes will follow A1c and get urine microalbumin study. Continue metformin  but she admits stated on using 1/2 of 1000 mg once daily. She did this due loose stools that resolved when took less of med.   For high cholesterol- continue crestor  and make changes if lipid level elevated.   For htn- continue toprol  and amlodipine . Bp wel controlled.     Follow up date to be determined after lab review.  Preventive Care 70-39 Years Old, Female Preventive care refers to lifestyle choices and visits with your health care provider that can promote health and wellness. Preventive care visits are also called wellness exams. What can I expect for my preventive care visit? Counseling Your health care provider may ask you questions about your: Medical history, including: Past medical problems. Family medical history. Pregnancy history. Current health, including: Menstrual cycle. Method of birth control. Emotional well-being. Home life and relationship well-being. Sexual activity and sexual health. Lifestyle, including: Alcohol, nicotine or tobacco, and drug use. Access to firearms. Diet, exercise, and sleep habits. Work and work Astronomer. Sunscreen use. Safety issues such as seatbelt and bike helmet use. Physical exam Your health care provider will check your: Height and weight. These may be used to calculate your BMI (body mass index). BMI is a measurement that tells if you are at a healthy weight. Waist circumference. This measures the distance around your waistline. This measurement also tells if you are at a healthy weight and may help predict your risk of certain diseases, such as type 2  diabetes and high blood pressure. Heart rate and blood pressure. Body temperature. Skin for abnormal spots. What immunizations do I need?  Vaccines are usually given at various ages, according to a schedule. Your health care provider will recommend vaccines for you based on your age, medical history, and lifestyle or other factors, such as travel or where you work. What tests do I need? Screening Your health care provider may recommend screening tests for certain conditions. This may include: Lipid and cholesterol levels. Diabetes screening. This is done by checking your blood sugar (glucose) after you have not eaten for a while (fasting). Pelvic exam and Pap test. Hepatitis B test. Hepatitis C test. HIV (human immunodeficiency virus) test. STI (sexually transmitted infection) testing, if you are at risk. Lung cancer screening. Colorectal cancer screening. Mammogram. Talk with your health care provider about when you should start having regular mammograms. This may depend on whether you have a family history of breast cancer. BRCA-related cancer screening. This may be done if you have a family history of breast, ovarian, tubal, or peritoneal cancers. Bone density scan. This is done to screen for osteoporosis. Talk with your health care provider about your test results, treatment options, and if necessary, the need for more tests. Follow these instructions at home: Eating and drinking  Eat a diet that includes fresh fruits and vegetables, whole grains, lean protein, and low-fat dairy products. Take vitamin and mineral supplements as recommended by your health care provider. Do not drink alcohol if: Your health care provider tells you not to drink. You are pregnant, may be pregnant, or are planning to  become pregnant. If you drink alcohol: Limit how much you have to 0-1 drink a day. Know how much alcohol is in your drink. In the [place] one drink equals one 12 oz bottle of beer (355 mL),  one 5 oz glass of wine (148 mL), or one 1 oz glass of hard liquor (44 mL). Lifestyle Brush your teeth every morning and night with fluoride toothpaste. Floss one time each day. Exercise for at least 30 minutes 5 or more days each week. Do not use any products that contain nicotine or tobacco. These products include cigarettes, chewing tobacco, and vaping devices, such as e-cigarettes. If you need help quitting, ask your health care provider. Do not use drugs. If you are sexually active, practice safe sex. Use a condom or other form of protection to prevent STIs. If you do not wish to become pregnant, use a form of birth control. If you plan to become pregnant, see your health care provider for a prepregnancy visit. Take aspirin only as told by your health care provider. Make sure that you understand how much to take and what form to take. Work with your health care provider to find out whether it is safe and beneficial for you to take aspirin daily. Find healthy ways to manage stress, such as: Meditation, yoga, or listening to music. Journaling. Talking to a trusted person. Spending time with friends and family. Minimize exposure to UV radiation to reduce your risk of skin cancer. Safety Always wear your seat belt while driving or riding in a vehicle. Do not drive: If you have been drinking alcohol. Do not ride with someone who has been drinking. When you are tired or distracted. While texting. If you have been using any mind-altering substances or drugs. Wear a helmet and other protective equipment during sports activities. If you have firearms in your house, make sure you follow all gun safety procedures. Seek help if you have been physically or sexually abused. What's next? Visit your health care provider once a year for an annual wellness visit. Ask your health care provider how often you should have your eyes and teeth checked. Stay up to date on all vaccines. This information is  not intended to replace advice given to you by your health care provider. Make sure you discuss any questions you have with your health care provider. Document Revised: 07/01/2020 Document Reviewed: 07/01/2020 Elsevier Patient Education  2024 [employer].

## 2023-06-10 ENCOUNTER — Ambulatory Visit: Payer: Self-pay | Admitting: Medical

## 2023-06-10 MED ORDER — ROSUVASTATIN CALCIUM 40 MG PO TABS: 40.0000 mg | ORAL_TABLET | Freq: Every day | ORAL | 3 refills | Status: AC

## 2023-06-10 MED ORDER — LINAGLIPTIN 5 MG PO TABS: 5.0000 mg | ORAL_TABLET | Freq: Every day | ORAL | 3 refills | Status: DC

## 2023-06-10 NOTE — Addendum Note (Signed)
 Addended by: Serafina Damme on: 06/10/2023 10:37 AM   Modules accepted: Orders

## 2023-06-10 NOTE — Addendum Note (Signed)
 Addended by: Serafina Damme on: 06/10/2023 10:35 AM   Modules accepted: Orders

## 2023-07-09 ENCOUNTER — Other Ambulatory Visit: Payer: Self-pay | Admitting: Medical

## 2023-08-07 ENCOUNTER — Other Ambulatory Visit: Payer: Self-pay | Admitting: Medical

## 2023-08-08 ENCOUNTER — Other Ambulatory Visit: Payer: Self-pay | Admitting: Medical

## 2023-08-14 ENCOUNTER — Other Ambulatory Visit: Payer: Self-pay | Admitting: Medical

## 2023-09-02 ENCOUNTER — Other Ambulatory Visit: Payer: Self-pay | Admitting: Medical

## 2023-09-10 ENCOUNTER — Other Ambulatory Visit: Payer: Self-pay | Admitting: Medical

## 2023-10-12 ENCOUNTER — Other Ambulatory Visit: Payer: Self-pay | Admitting: Medical

## 2023-10-31 ENCOUNTER — Ambulatory Visit: Admitting: Medical

## 2023-11-07 ENCOUNTER — Ambulatory Visit (INDEPENDENT_AMBULATORY_CARE_PROVIDER_SITE_OTHER): Admitting: Medical

## 2023-11-07 ENCOUNTER — Ambulatory Visit: Payer: Self-pay | Admitting: Medical

## 2023-11-07 VITALS — BP 126/82 | HR 88 | Temp 98.4°F | Resp 15 | Ht 65.0 in | Wt 239.6 lb

## 2023-11-07 DIAGNOSIS — R87619 Unspecified abnormal cytological findings in specimens from cervix uteri: Secondary | ICD-10-CM | POA: Diagnosis not present

## 2023-11-07 DIAGNOSIS — E785 Hyperlipidemia, unspecified: Secondary | ICD-10-CM

## 2023-11-07 DIAGNOSIS — I10 Essential (primary) hypertension: Secondary | ICD-10-CM

## 2023-11-07 DIAGNOSIS — Z7984 Long term (current) use of oral hypoglycemic drugs: Secondary | ICD-10-CM

## 2023-11-07 DIAGNOSIS — E119 Type 2 diabetes mellitus without complications: Secondary | ICD-10-CM

## 2023-11-07 LAB — LIPID PANEL
Cholesterol: 161 mg/dL (ref 0–200)
HDL: 49.5 mg/dL (ref >39.00)
LDL Cholesterol: 94 mg/dL (ref 0–99)
NonHDL: 111.46
Total CHOL/HDL Ratio: 3
Triglycerides: 89 mg/dL (ref 0.0–149.0)
VLDL: 17.8 mg/dL (ref 0.0–40.0)

## 2023-11-07 LAB — COMPLETE METABOLIC PANEL WITHOUT GFR
AG Ratio: 1.7 (calc) (ref 1.0–2.5)
ALT: 20 U/L (ref 6–29)
AST: 22 U/L (ref 10–35)
Albumin: 4.8 g/dL (ref 3.6–5.1)
Alkaline phosphatase (APISO): 67 U/L (ref 37–153)
BUN: 11 mg/dL (ref 7–25)
CO2: 32 mmol/L (ref 20–32)
Calcium: 9.8 mg/dL (ref 8.6–10.4)
Chloride: 101 mmol/L (ref 98–110)
Creat: 0.78 mg/dL (ref 0.50–1.03)
Globulin: 2.8 g/dL (ref 1.9–3.7)
Glucose, Bld: 106 mg/dL — ABNORMAL HIGH (ref 65–99)
Potassium: 4.2 mmol/L (ref 3.5–5.3)
Sodium: 141 mmol/L (ref 135–146)
Total Bilirubin: 0.4 mg/dL (ref 0.2–1.2)
Total Protein: 7.6 g/dL (ref 6.1–8.1)

## 2023-11-07 LAB — HEMOGLOBIN A1C: Hgb A1c MFr Bld: 7.2 % — ABNORMAL HIGH (ref 4.6–6.5)

## 2023-11-07 MED ORDER — LINAGLIPTIN 5 MG PO TABS: 5.0000 mg | ORAL_TABLET | Freq: Every day | ORAL | 3 refills | Status: AC

## 2023-11-07 NOTE — Addendum Note (Signed)
 Addended by: DORINA DALLAS HERO on: 11/07/2023 08:41 PM   Modules accepted: Orders

## 2023-11-07 NOTE — Patient Instructions (Signed)
 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 - Refer to a female gynecologist for repeat Pap smear and further evaluation.  Type 2 Diabetes Mellitus A1c of 7.2% indicates suboptimal control. Non-adherence to Tradjenta . Mildly elevated urine microalbumin suggests need for improved glycemic control. - Order A1c and metabolic panel to assess current glycemic control. -continue metformin  1000 mg twice daily - Reassess the need for Tradjenta  5 mg daily based on A1c results. - Encourage adherence to a low sugar diet and regular exercise.  Hypertension Blood pressure controlled at 126/82 mmHg on current regimen. - Continue amlodipine  5 mg daily and metoprolol  25 mg daily.  Hyperlipidemia Due for lipid panel to assess cholesterol levels. - Order lipid panel to assess cholesterol levels.  General Health Maintenance Due for mammogram and diabetic eye exam. Colonoscopy not due for five years. - Schedule a mammogram in about a month. - Schedule a diabetic eye exam and ensure the report is sent to the office.  Follow-up Follow-up contingent on lab results and specialist evaluations. - Review lab results and determine follow-up date after lab review.

## 2023-11-07 NOTE — Progress Notes (Signed)
 Subjective:    Patient ID: Christina Leblanc, female    DOB: 1970-09-26, 53 y.o.   MRN: [id number]  HPI  Christina Leblanc is a 53 year old female who presents for a routine follow-up and referral to a female gynecologist.  Her last Pap smear in April 2022 was abnormal, showing high-risk HPV. She has not had a follow-up since and seeks a referral to a female gynecologist for a repeat Pap smear.  She manages hypertension with amlodipine  5 mg and metoprolol  xr 25 mg daily.  Her diabetes management includes metformin  1000 mg twice daily, but she has not been taking Tradjenta  5 mg. Her last A1c was 7.2%, and she has a history of mildly elevated urine microalbumin.  She engages in regular physical activity, walking about a mile and a half daily, and attempts to maintain a low-sugar diet. She had an orange for breakfast today.  She underwent a colonoscopy in 2022. She has not had a diabetic eye exam this year.      Review of Systems  Constitutional:  Negative for chills and fatigue.  HENT:  Negative for congestion and ear pain.   Respiratory:  Negative for choking, shortness of breath and wheezing.   Cardiovascular:  Negative for chest pain and palpitations.  Gastrointestinal:  Negative for abdominal pain.  Genitourinary:  Negative for dysuria, frequency, urgency and vaginal discharge.  Musculoskeletal:  Negative for back pain.  Skin:  Negative for rash.  Neurological:  Negative for dizziness, speech difficulty, weakness and light-headedness.  Hematological:  Negative for adenopathy.  Psychiatric/Behavioral:  Negative for behavioral problems and dysphoric mood. The patient is not nervous/anxious.     Past Medical History:  Diagnosis Date   Allergy    Diabetes mellitus without complication (HCC)    Hyperlipidemia    Hypertension      Social History   Socioeconomic History   Marital status: Divorced    Spouse name: Not on file   Number of children: Not on file   Years of  education: Not on file   Highest education level: Not on file  Occupational History   Not on file  Tobacco Use   Smoking status: Never   Smokeless tobacco: Never  Vaping Use   Vaping status: Never Used  Substance and Sexual Activity   Alcohol use: Yes    Alcohol/week: 0.0 standard drinks of alcohol    Comment: wine occasionally .Twice.   Drug use: No   Sexual activity: Not Currently    Birth control/protection: Surgical  Other Topics Concern   Not on file  Social History Narrative   Not on file   Social Drivers of Health   Financial Resource Strain: Not on file  Food Insecurity: Not on file  Transportation Needs: Not on file  Physical Activity: Not on file  Stress: Not on file  Social Connections: Not on file  Intimate Partner Violence: Not on file    Past Surgical History:  Procedure Laterality Date   ABDOMINAL HYSTERECTOMY     2003   MYOMECTOMY ABDOMINAL APPROACH      Family History  Problem Relation Age of Onset   Diabetes Mother    Diabetes Father    Heart disease Father    Cancer Brother    Hypertension Brother    Colon polyps Neg Hx    Colon cancer Neg Hx    Esophageal cancer Neg Hx    Stomach cancer Neg Hx    Rectal cancer Neg Hx  Allergies  Allergen Reactions   Claritin  [Loratadine ] Palpitations    Current Outpatient Medications on File Prior to Visit  Medication Sig Dispense Refill   acetaminophen (TYLENOL) 500 MG tablet Take 500 mg by mouth every 6 (six) hours as needed.     amLODipine  (NORVASC ) 5 MG tablet Take 1 tablet by mouth once daily 30 tablet 0   azelastine  (ASTELIN ) 0.1 % nasal spray 2 sprays each nostril at night. 30 mL 12   blood glucose meter kit and supplies Check blood sugar daily  (E11.9). 1 each 0   budesonide -formoterol  (SYMBICORT ) 160-4.5 MCG/ACT inhaler Inhale 2 puffs into the lungs 2 (two) times daily. 1 each 3   cyclobenzaprine  (FLEXERIL ) 5 MG tablet Take 1 tablet (5 mg total) by mouth at bedtime. 10 tablet 0    famotidine  (PEPCID ) 20 MG tablet Take 1 tablet (20 mg total) by mouth daily. 30 tablet 2   fluticasone  (FLONASE ) 50 MCG/ACT nasal spray 2 sprays each nostril daily in morning. 16 g 1   levocetirizine (XYZAL ) 5 MG tablet Take 1 tablet (5 mg total) by mouth every evening. 90 tablet 3   linagliptin  (TRADJENTA ) 5 MG TABS tablet Take 1 tablet (5 mg total) by mouth daily. 90 tablet 3   metFORMIN  (GLUCOPHAGE ) 1000 MG tablet TAKE 1 TABLET BY MOUTH TWICE DAILY WITH MEALS 60 tablet 0   metoprolol  succinate (TOPROL -XL) 25 MG 24 hr tablet Take 1 tablet by mouth once daily 90 tablet 0   rosuvastatin  (CRESTOR ) 40 MG tablet Take 1 tablet (40 mg total) by mouth daily. 90 tablet 3   No current facility-administered medications on file prior to visit.    BP 126/82   Pulse 88   Temp 98.4 F (36.9 C) (Oral)   Resp 15   Ht 5' 5 (1.651 m)   Wt 239 lb 9.6 oz (108.7 kg)   SpO2 95%   BMI 39.87 kg/m        Objective:   Physical Exam  General Mental Status- Alert. General Appearance- Not in acute distress.   Skin General: Color- Normal Color. Moisture- Normal Moisture.  Neck Carotid Arteries- Normal color. Moisture- Normal Moisture. No carotid bruits. No JVD.  Chest and Lung Exam Auscultation: Breath Sounds:-Normal.  Cardiovascular Auscultation:Rythm- Regular. Murmurs & Other Heart Sounds:Auscultation of the heart reveals- No Murmurs.  Abdomen Inspection:-Inspeection Normal. Palpation/Percussion:Note:No mass. Palpation and Percussion of the abdomen reveal- Non Tender, Non Distended + BS, no rebound or guarding.   Neurologic Cranial Nerve exam:- CN III-XII intact(No nystagmus), symmetric smile. Strength:- 5/5 equal and symmetric strength both upper and lower extremities.    Lower ext- calfs symmetric, negative homans aigns.      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 - Refer to a female  gynecologist for repeat Pap smear and further evaluation.  Type 2 Diabetes Mellitus A1c of 7.2% indicates suboptimal control. Non-adherence to Tradjenta . Mildly elevated urine microalbumin suggests need for improved glycemic control. - Order A1c and metabolic panel to assess current glycemic control. -continue metformin  1000 mg twice daily - Reassess the need for Tradjenta  5 mg daily based on A1c results. - Encourage adherence to a low sugar diet and regular exercise.  Hypertension Blood pressure controlled at 126/82 mmHg on current regimen. - Continue amlodipine  5 mg daily and metoprolol  25 mg daily.  Hyperlipidemia Due for lipid panel to assess cholesterol levels. - Order lipid panel to assess cholesterol levels.  General Health Maintenance Due for mammogram and diabetic eye exam. Colonoscopy not due for five years. - Schedule a mammogram in about a month. - Schedule a diabetic eye exam and ensure the report is sent to the office.  Follow-up Follow-up contingent on lab results and specialist evaluations. - Review lab results and determine follow-up date after lab review.   [place], PA-C

## 2023-11-07 NOTE — Addendum Note (Signed)
 Addended by: DORLENE CHIQUITA RAMAN on: 11/07/2023 03:04 PM   Modules accepted: Orders

## 2023-11-08 LAB — MICROALBUMIN / CREATININE URINE RATIO
Creatinine,U: 20 mg/dL
Microalb Creat Ratio: 44 mg/g — ABNORMAL HIGH (ref 0.0–30.0)
Microalb, Ur: 0.9 mg/dL (ref 0.0–1.9)

## 2023-11-09 ENCOUNTER — Other Ambulatory Visit: Payer: Self-pay | Admitting: Medical

## 2023-11-10 ENCOUNTER — Other Ambulatory Visit: Payer: Self-pay | Admitting: Medical

## 2023-11-21 ENCOUNTER — Other Ambulatory Visit: Payer: Self-pay | Admitting: Medical

## 2023-11-22 ENCOUNTER — Other Ambulatory Visit: Payer: Self-pay

## 2023-11-22 MED ORDER — METOPROLOL SUCCINATE ER 25 MG PO TB24: 25.0000 mg | ORAL_TABLET | Freq: Every day | ORAL | 0 refills | Status: AC

## 2023-12-17 ENCOUNTER — Other Ambulatory Visit: Payer: Self-pay | Admitting: Family Medicine

## 2024-01-10 ENCOUNTER — Other Ambulatory Visit: Payer: Self-pay | Admitting: Medical
